# Patient Record
Sex: Male | Born: 1960
Health system: Southern US, Community
[De-identification: ages and names within clinical notes are randomized; demographics above are authoritative.]

## PROBLEM LIST (undated history)

## (undated) DIAGNOSIS — T7840XA Allergy, unspecified, initial encounter: Secondary | ICD-10-CM

## (undated) DIAGNOSIS — K559 Vascular disorder of intestine, unspecified: Secondary | ICD-10-CM

## (undated) HISTORY — DX: Allergy, unspecified, initial encounter: T78.40XA

## (undated) HISTORY — DX: Vascular disorder of intestine, unspecified: K55.9

---

## 2014-03-01 ENCOUNTER — Ambulatory Visit (INDEPENDENT_AMBULATORY_CARE_PROVIDER_SITE_OTHER): Payer: BC Managed Care – PPO | Admitting: Family Medicine

## 2014-03-01 VITALS — BP 138/90 | HR 72 | Temp 97.8°F | Resp 18 | Ht 71.0 in | Wt 227.2 lb

## 2014-03-01 DIAGNOSIS — R220 Localized swelling, mass and lump, head: Secondary | ICD-10-CM

## 2014-03-01 DIAGNOSIS — R05 Cough: Secondary | ICD-10-CM

## 2014-03-01 DIAGNOSIS — R059 Cough, unspecified: Secondary | ICD-10-CM

## 2014-03-01 DIAGNOSIS — R221 Localized swelling, mass and lump, neck: Secondary | ICD-10-CM

## 2014-03-01 DIAGNOSIS — J329 Chronic sinusitis, unspecified: Secondary | ICD-10-CM

## 2014-03-01 MED ORDER — AMOXICILLIN-POT CLAVULANATE 875-125 MG PO TABS: 1.0000 | ORAL_TABLET | Freq: Two times a day (BID) | ORAL | Status: DC

## 2014-03-01 MED ORDER — HYDROCOD POLST-CHLORPHEN POLST 10-8 MG/5ML PO LQCR: 5.0000 mL | Freq: Every evening | ORAL | Status: DC | PRN

## 2014-03-01 MED ORDER — PREDNISONE 20 MG PO TABS: ORAL_TABLET | ORAL | Status: DC

## 2014-03-01 NOTE — Progress Notes (Signed)
° °  Subjective:    Patient ID: Billy Keller., male    DOB: 01/04/1961, 53 y.o.   MRN: [id number]  URI  Associated symptoms include congestion and coughing.   Chief Complaint  Patient presents with   URI    Nasal/cough-yellow green; chest tightness;chest congestion x 2 wks   This chart was scribed for [name], MD by Thea Alken, ED Scribe. This patient was seen in room 13 and the patient's care was started at 10:39 AM.  HPI Comments: Selso Mannor. is a 53 y.o. male who presents to the [facility] complaining of a productive cough consisting of yellow-green sputum and nasal congestion. Pt reports he has been coughing during the night. Pt was seen 2 weeks ago by PCP and was prescribed with cough syrup relief to symptoms. Pt report he experiences these symptoms annually due to tree pollen. Pt states he has to wear a mask when working outside. Pt denies allergies to medication.  Pt works in [employer]   History reviewed. No pertinent past medical history. No Known Allergies Prior to Admission medications   Not on File    Review of Systems  HENT: Positive for congestion.   Respiratory: Positive for cough.        Objective:   Physical Exam  Nursing note and vitals reviewed. Constitutional: He is oriented to person, place, and time. He appears well-developed and well-nourished. No distress.  HENT:  Head: Normocephalic and atraumatic.  Right Ear: Tympanic membrane normal.  Left Ear: Tympanic membrane normal.  Mouth/Throat: Posterior oropharyngeal erythema present.  Mucous purulent discharge from nasal passageway   Eyes: EOM are normal.  Neck: Neck supple.  Cardiovascular: Normal rate.   Pulmonary/Chest: Effort normal. He has wheezes ( faint).  Musculoskeletal: Normal range of motion.  Neurological: He is alert and oriented to person, place, and time.  Skin: Skin is warm and dry.  Psychiatric: He has a normal mood and affect. His behavior is normal.        Assessment & Plan:   1. Sinusitis   2. Cough    Meds ordered this encounter  Medications   amoxicillin-clavulanate (AUGMENTIN) 875-125 MG per tablet    Sig: Take 1 tablet by mouth 2 (two) times daily.    Dispense:  20 tablet    Refill:  0   chlorpheniramine-HYDROcodone (TUSSIONEX PENNKINETIC ER) 10-8 MG/5ML LQCR    Sig: Take 5 mLs by mouth at bedtime as needed for cough.    Dispense:  115 mL    Refill:  0   predniSONE (DELTASONE) 20 MG tablet    Sig: 2 daily with food    Dispense:  10 tablet    Refill:  1    Sinusitis - Plan: amoxicillin-clavulanate (AUGMENTIN) 875-125 MG per tablet, chlorpheniramine-HYDROcodone (TUSSIONEX PENNKINETIC ER) 10-8 MG/5ML LQCR, predniSONE (DELTASONE) 20 MG tablet  Cough - Plan: chlorpheniramine-HYDROcodone (TUSSIONEX PENNKINETIC ER) 10-8 MG/5ML LQCR  Signed, [name], MD

## 2014-03-01 NOTE — Patient Instructions (Signed)

## 2014-06-27 ENCOUNTER — Other Ambulatory Visit: Payer: Self-pay | Admitting: Family Medicine

## 2014-07-14 ENCOUNTER — Ambulatory Visit (INDEPENDENT_AMBULATORY_CARE_PROVIDER_SITE_OTHER): Payer: BC Managed Care – PPO

## 2014-07-14 ENCOUNTER — Ambulatory Visit (INDEPENDENT_AMBULATORY_CARE_PROVIDER_SITE_OTHER): Payer: BC Managed Care – PPO | Admitting: Emergency Medicine

## 2014-07-14 VITALS — BP 130/86 | HR 89 | Temp 98.0°F | Resp 16 | Ht 72.0 in | Wt 228.4 lb

## 2014-07-14 DIAGNOSIS — M25511 Pain in right shoulder: Secondary | ICD-10-CM

## 2014-07-14 DIAGNOSIS — M25512 Pain in left shoulder: Secondary | ICD-10-CM

## 2014-07-14 MED ORDER — TRIAMCINOLONE ACETONIDE 40 MG/ML IJ SUSP
40.0000 mg | Freq: Once | INTRAMUSCULAR | Status: AC
  Administered 2014-07-14: 40 mg via INTRAMUSCULAR

## 2014-07-14 MED ORDER — HYDROCODONE-ACETAMINOPHEN 5-325 MG PO TABS
ORAL_TABLET | ORAL | Status: DC
Start: 2014-07-14 — End: 2014-10-27

## 2014-07-14 MED ORDER — FEXOFENADINE-PSEUDOEPHED ER 180-240 MG PO TB24: 1.0000 | ORAL_TABLET | Freq: Every day | ORAL | Status: DC

## 2014-07-14 MED ORDER — MELOXICAM 15 MG PO TABS: 15.0000 mg | ORAL_TABLET | Freq: Every day | ORAL | Status: DC

## 2014-07-14 NOTE — Patient Instructions (Signed)
Rotator Cuff Injury Rotator cuff injury is any type of injury to the set of muscles and tendons that make up the stabilizing unit of your shoulder. This unit holds the ball of your upper arm bone (humerus) in the socket of your shoulder blade (scapula).  CAUSES Injuries to your rotator cuff most commonly come from sports or activities that cause your arm to be moved repeatedly over your head. Examples of this include throwing, weight lifting, swimming, or racquet sports. Long lasting (chronic) irritation of your rotator cuff can cause soreness and swelling (inflammation), bursitis, and eventual damage to your tendons, such as a tear (rupture). SIGNS AND SYMPTOMS Acute rotator cuff tear:  Sudden tearing sensation followed by severe pain shooting from your upper shoulder down your arm toward your elbow.  Decreased range of motion of your shoulder because of pain and muscle spasm.  Severe pain.  Inability to raise your arm out to the side because of pain and loss of muscle power (large tears). Chronic rotator cuff tear:  Pain that usually is worse at night and may interfere with sleep.  Gradual weakness and decreased shoulder motion as the pain worsens.  Decreased range of motion. Rotator cuff tendinitis:  Deep ache in your shoulder and the outside upper arm over your shoulder.  Pain that comes on gradually and becomes worse when lifting your arm to the side or turning it inward. DIAGNOSIS Rotator cuff injury is diagnosed through a medical history, physical exam, and imaging exam. The medical history helps determine the type of rotator cuff injury. Your health care provider will look at your injured shoulder, feel the injured area, and ask you to move your shoulder in different positions. X-ray exams typically are done to rule out other causes of shoulder pain, such as fractures. MRI is the exam of choice for the most severe shoulder injuries because the images show muscles and tendons.    TREATMENT  Chronic tear:  Medicine for pain, such as acetaminophen or ibuprofen.  Physical therapy and range-of-motion exercises may be helpful in maintaining shoulder function and strength.  Steroid injections into your shoulder joint.  Surgical repair of the rotator cuff if the injury does not heal with noninvasive treatment. Acute tear:  Anti-inflammatory medicines such as ibuprofen and naproxen to help reduce pain and swelling.  A sling to help support your arm and rest your rotator cuff muscles. Long-term use of a sling is not advised. It may cause significant stiffening of the shoulder joint.  Surgery may be considered within a few weeks, especially in younger, active people, to return the shoulder to full function.  Indications for surgical treatment include the following:  Age younger than 60 years.  Rotator cuff tears that are complete.  Physical therapy, rest, and anti-inflammatory medicines have been used for 6-8 weeks, with no improvement.  Employment or sporting activity that requires constant shoulder use. Tendinitis:  Anti-inflammatory medicines such as ibuprofen and naproxen to help reduce pain and swelling.  A sling to help support your arm and rest your rotator cuff muscles. Long-term use of a sling is not advised. It may cause significant stiffening of the shoulder joint.  Severe tendinitis may require:  Steroid injections into your shoulder joint.  Physical therapy.  Surgery. HOME CARE INSTRUCTIONS   Apply ice to your injury:  Put ice in a plastic bag.  Place a towel between your skin and the bag.  Leave the ice on for 20 minutes, 2-3 times a day.  If you   have a shoulder immobilizer (sling and straps), wear it until told otherwise by your health care provider.  You may want to sleep on several pillows or in a recliner at night to lessen swelling and pain.  Only take over-the-counter or prescription medicines for pain, discomfort, or fever as  directed by your health care provider.  Do simple hand squeezing exercises with a soft rubber ball to decrease hand swelling. SEEK MEDICAL CARE IF:   Your shoulder pain increases, or new pain or numbness develops in your arm, hand, or fingers.  Your hand or fingers are colder than your other hand. SEEK IMMEDIATE MEDICAL CARE IF:   Your arm, hand, or fingers are numb or tingling.  Your arm, hand, or fingers are increasingly swollen and painful, or they turn white or blue. MAKE SURE YOU:  Understand these instructions.  Will watch your condition.  Will get help right away if you are not doing well or get worse. Document Released: 09/21/2000 Document Revised: 09/29/2013 Document Reviewed: 05/06/2013 [employer] Patient Information 2015 [employer]. This information is not intended to replace advice given to you by your health care provider. Make sure you discuss any questions you have with your health care provider.  

## 2014-07-14 NOTE — Progress Notes (Signed)
Subjective:    Patient ID: Billy Keller., male    DOB: 1961-01-16, 53 y.o.   MRN: [id number] This chart was scribed for Billy Queen, MD by Marti Sleigh, Medical Scribe. This patient was seen in Room 1 and the patient's care was started at 11:21 AM.  Shoulder Pain  The pain is present in the right shoulder and left shoulder. This is a chronic problem. The current episode started more than 1 month ago. There has been no history of extremity trauma. The problem occurs constantly. The problem has been waxing and waning. Pertinent negatives include no limited range of motion. The symptoms are aggravated by activity.   HPI Comments: [name]. is a 53 y.o. male who presents to [facility] complaining of constant bilateral shoulder pain that started two months ago, and is worse in right than left. Pt states he takes ibuprofen regularly for pain. 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 Pt states that he does Physical Therapy exercises that he got from a PT when he was younger for a similar issue. Pt has not seen anyone for his pain in many years.    Review of Systems  Constitutional: Negative for appetite change and fatigue.  HENT: Negative for congestion, ear discharge and sinus pressure.   Eyes: Negative for discharge.  Respiratory: Negative for cough.   Cardiovascular: Negative for chest pain.  Gastrointestinal: Negative for abdominal pain and diarrhea.  Genitourinary: Negative for frequency and hematuria.  Musculoskeletal:       Bilateral shoulder pain, worse on right.  Skin: Negative for rash.  Neurological: Negative for seizures and headaches.  Psychiatric/Behavioral: Negative for hallucinations.       Objective:   Physical Exam  Constitutional: He is oriented to person, place, and time. He appears well-developed.  HENT:  Head: Normocephalic.  Eyes: Conjunctivae and EOM are normal. No scleral icterus.  Neck: Neck supple. No  thyromegaly present.  Cardiovascular: Normal rate and regular rhythm.  Exam reveals no gallop and no friction rub.   No murmur heard. Pulmonary/Chest: No stridor. He has no wheezes. He has no rales. He exhibits no tenderness.  Abdominal: He exhibits no distension. There is no tenderness. There is no rebound.  Musculoskeletal: Normal range of motion. He exhibits no tenderness.  Full ROM, bilaterally. Motor strength and reflex normal. Weakness on external rotation with resistance. No internal rotation weakness.  Lymphadenopathy:    He has no cervical adenopathy.  Neurological: He is oriented to person, place, and time. He exhibits normal muscle tone. Coordination normal.  Skin: No rash noted. No erythema.  Psychiatric: He has a normal mood and affect. His behavior is normal.   [facility] reading (PRIMARY) by  Dr.Zyler Hyson there are mild arthritic changes of both shoulders worse on the right  PROCEDURE NOTE: The subdeltoid area was prepped with Betadine x2 swab with an alcohol swab and while wearing gloves the area was numbed with ethyl chloride followed by injection with 40 of Kenalog and 2 cc of 2% plain. The patient tolerated the procedure well      Assessment & Plan:  Patient with a tendinitis bursitis of the right shoulder. He is having chronic shoulder problems. He was given Thera-Band for his shoulder exercises. I told him I would be happy to refer him to physical therapy. I did refill his Allegra D. he takes for chronic sinus problems. I did not feel his sleep medicine. I did place him  on Mobic one every morning along with hydrocodone at night for pain.

## 2014-10-19 ENCOUNTER — Telehealth: Payer: Self-pay | Admitting: Internal Medicine

## 2014-10-19 ENCOUNTER — Ambulatory Visit (INDEPENDENT_AMBULATORY_CARE_PROVIDER_SITE_OTHER): Payer: BLUE CROSS/BLUE SHIELD | Admitting: Internal Medicine

## 2014-10-19 ENCOUNTER — Ambulatory Visit: Payer: Self-pay | Admitting: Physician Assistant

## 2014-10-19 VITALS — BP 180/96 | HR 52 | Temp 98.0°F | Resp 18 | Ht 72.5 in | Wt 224.0 lb

## 2014-10-19 DIAGNOSIS — R112 Nausea with vomiting, unspecified: Secondary | ICD-10-CM

## 2014-10-19 DIAGNOSIS — R197 Diarrhea, unspecified: Secondary | ICD-10-CM

## 2014-10-19 DIAGNOSIS — K529 Noninfective gastroenteritis and colitis, unspecified: Secondary | ICD-10-CM

## 2014-10-19 DIAGNOSIS — K5289 Other specified noninfective gastroenteritis and colitis: Secondary | ICD-10-CM

## 2014-10-19 DIAGNOSIS — K921 Melena: Secondary | ICD-10-CM

## 2014-10-19 DIAGNOSIS — A09 Infectious gastroenteritis and colitis, unspecified: Secondary | ICD-10-CM

## 2014-10-19 LAB — POCT UA - MICROSCOPIC ONLY
CRYSTALS, UR, HPF, POC: NEGATIVE
Casts, Ur, LPF, POC: NEGATIVE
Mucus, UA: POSITIVE
Yeast, UA: NEGATIVE

## 2014-10-19 LAB — POCT CBC
Granulocyte percent: 86.1 %G — AB (ref 37–80)
HEMATOCRIT: 45.9 % (ref 43.5–53.7)
HEMOGLOBIN: 15.4 g/dL (ref 14.1–18.1)
LYMPH, POC: 0.9 (ref 0.6–3.4)
MCH: 31.7 pg — AB (ref 27–31.2)
MCHC: 33.5 g/dL (ref 31.8–35.4)
MCV: 94.5 fL (ref 80–97)
MID (cbc): 0.5 (ref 0–0.9)
MPV: 6.9 fL (ref 0–99.8)
POC Granulocyte: 8.5 — AB (ref 2–6.9)
POC LYMPH %: 9.1 % — AB (ref 10–50)
POC MID %: 4.8 %M (ref 0–12)
Platelet Count, POC: 266 10*3/uL (ref 142–424)
RBC: 4.86 M/uL (ref 4.69–6.13)
RDW, POC: 12.6 %
WBC: 9.9 10*3/uL (ref 4.6–10.2)

## 2014-10-19 LAB — POCT URINALYSIS DIPSTICK
Bilirubin, UA: NEGATIVE
Glucose, UA: NEGATIVE
Leukocytes, UA: NEGATIVE
Nitrite, UA: NEGATIVE
UROBILINOGEN UA: 0.2
pH, UA: 5.5

## 2014-10-19 LAB — COMPREHENSIVE METABOLIC PANEL
ALT: 39 U/L (ref 0–53)
AST: 21 U/L (ref 0–37)
Albumin: 4.1 g/dL (ref 3.5–5.2)
Alkaline Phosphatase: 65 U/L (ref 39–117)
BUN: 17 mg/dL (ref 6–23)
CO2: 25 meq/L (ref 19–32)
Calcium: 9.7 mg/dL (ref 8.4–10.5)
Chloride: 102 mEq/L (ref 96–112)
Creat: 0.92 mg/dL (ref 0.50–1.35)
Glucose, Bld: 122 mg/dL — ABNORMAL HIGH (ref 70–99)
Potassium: 4.5 mEq/L (ref 3.5–5.3)
SODIUM: 135 meq/L (ref 135–145)
TOTAL PROTEIN: 6.9 g/dL (ref 6.0–8.3)
Total Bilirubin: 0.8 mg/dL (ref 0.2–1.2)

## 2014-10-19 LAB — IFOBT (OCCULT BLOOD): IMMUNOLOGICAL FECAL OCCULT BLOOD TEST: POSITIVE

## 2014-10-19 LAB — LIPASE: Lipase: 11 U/L (ref 0–75)

## 2014-10-19 MED ORDER — ONDANSETRON 4 MG PO TBDP: 4.0000 mg | ORAL_TABLET | Freq: Once | ORAL | Status: DC

## 2014-10-19 MED ORDER — HYDROCODONE-ACETAMINOPHEN 5-325 MG PO TABS
1.0000 | ORAL_TABLET | Freq: Four times a day (QID) | ORAL | Status: DC | PRN
Start: 2014-10-19 — End: 2015-04-04

## 2014-10-19 MED ORDER — CIPROFLOXACIN HCL 500 MG PO TABS: 500.0000 mg | ORAL_TABLET | Freq: Two times a day (BID) | ORAL | Status: DC

## 2014-10-19 MED ORDER — ONDANSETRON HCL 8 MG PO TABS: 8.0000 mg | ORAL_TABLET | Freq: Three times a day (TID) | ORAL | Status: DC | PRN

## 2014-10-19 MED ORDER — ONDANSETRON 4 MG PO TBDP
8.0000 mg | ORAL_TABLET | Freq: Once | ORAL | Status: AC
  Administered 2014-10-19: 8 mg via ORAL

## 2014-10-19 NOTE — Progress Notes (Signed)
Subjective:    Patient ID: Billy Belts., male    DOB: 11-Mar-1961, 54 y.o.   MRN: [id number]  HPI CO 10hrs of cramps and diarrhea and vomiting for 4 hrs 3-4 times. Stool is brown and vomitus is bilious. Crampy pain, no fever but sweaty. No other family sick. Could have been chx salad from yesterday. He is alert and jokes while giving hx.NO chronic diseases.   Review of Systems neg    Objective:   Physical Exam  Constitutional: He is oriented to person, place, and time. He appears well-developed and well-nourished. No distress.  HENT:  Head: Normocephalic.  Right Ear: External ear normal.  Left Ear: External ear normal.  Mouth/Throat: Oropharynx is clear and moist.  Eyes: EOM are normal. Pupils are equal, round, and reactive to light. No scleral icterus.  Neck: Normal range of motion.  Cardiovascular: Normal rate, regular rhythm and normal heart sounds.   Pulmonary/Chest: Effort normal and breath sounds normal.  Abdominal: He exhibits no distension and no mass. There is no tenderness. There is no guarding.  Neurological: He is alert and oriented to person, place, and time. He exhibits normal muscle tone. Coordination normal.  Psychiatric: He has a normal mood and affect. His behavior is normal. Thought content normal.  Vitals reviewed.  BP lying 170/92 pulse 49  Standing 180/52 pulse 52  No postural signs  Zofran ODT 8mg  now Trial oral hydration Results for orders placed or performed in visit on 10/19/14  POCT UA - Microscopic Only  Result Value Ref Range   WBC, Ur, HPF, POC 4-12    RBC, urine, microscopic 1-6    Bacteria, U Microscopic trace    Mucus, UA positive    Epithelial cells, urine per micros 1-2    Crystals, Ur, HPF, POC negative    Casts, Ur, LPF, POC negative    Yeast, UA negative   POCT urinalysis dipstick  Result Value Ref Range   Color, UA Yellow    Clarity, UA clear    Glucose, UA Neg    Bilirubin, UA Neg    Ketones, UA Trace    Spec Grav, UA  >=1.030    Blood, UA Trace    pH, UA 5.5    Protein, UA Trace    Urobilinogen, UA 0.2    Nitrite, UA Neg    Leukocytes, UA Negative   POCT CBC  Result Value Ref Range   WBC 9.9 4.6 - 10.2 K/uL   Lymph, poc 0.9 0.6 - 3.4   POC LYMPH PERCENT 9.1 (A) 10 - 50 %L   MID (cbc) 0.5 0 - 0.9   POC MID % 4.8 0 - 12 %M   POC Granulocyte 8.5 (A) 2 - 6.9   Granulocyte percent 86.1 (A) 37 - 80 %G   RBC 4.86 4.69 - 6.13 M/uL   Hemoglobin 15.4 14.1 - 18.1 g/dL   HCT, POC 45.9 43.5 - 53.7 %   MCV 94.5 80 - 97 fL   MCH, POC 31.7 (A) 27 - 31.2 pg   MCHC 33.5 31.8 - 35.4 g/dL   RDW, POC 12.6 %   Platelet Count, POC 266 142 - 424 K/uL   MPV 6.9 0 - 99.8 fL   Has kept 2 bottles gatorade down, no vomiting.Diarrhea persists On chart review bradycardia is new and could indicate salmonella infection      Assessment & Plan:  Possible salmonella gastroenteritis/Culture obtained/Frank bloody colitis Bradycardia suggestive of salmonella bradycardia syndrome Cipro 500mg   bid 20 Zofran 8mg  tid prn/Immodium prn Diet hydration See GI today or tomorrow appt made

## 2014-10-19 NOTE — Patient Instructions (Addendum)
Viral Gastroenteritis Viral gastroenteritis is also known as stomach flu. This condition affects the stomach and intestinal tract. It can cause sudden diarrhea and vomiting. The illness typically lasts 3 to 8 days. Most people develop an immune response that eventually gets rid of the virus. While this natural response develops, the virus can make you quite ill. CAUSES  Many different viruses can cause gastroenteritis, such as rotavirus or noroviruses. You can catch one of these viruses by consuming contaminated food or water. You may also catch a virus by sharing utensils or other personal items with an infected person or by touching a contaminated surface. SYMPTOMS  The most common symptoms are diarrhea and vomiting. These problems can cause a severe loss of body fluids (dehydration) and a body salt (electrolyte) imbalance. Other symptoms may include:  Fever.  Headache.  Fatigue.  Abdominal pain. DIAGNOSIS  Your caregiver can usually diagnose viral gastroenteritis based on your symptoms and a physical exam. A stool sample may also be taken to test for the presence of viruses or other infections. TREATMENT  This illness typically goes away on its own. Treatments are aimed at rehydration. The most serious cases of viral gastroenteritis involve vomiting so severely that you are not able to keep fluids down. In these cases, fluids must be given through an intravenous line (IV). HOME CARE INSTRUCTIONS   Drink enough fluids to keep your urine clear or pale yellow. Drink small amounts of fluids frequently and increase the amounts as tolerated.  Ask your caregiver for specific rehydration instructions.  Avoid:  Foods high in sugar.  Alcohol.  Carbonated drinks.  Tobacco.  Juice.  Caffeine drinks.  Extremely hot or cold fluids.  Fatty, greasy foods.  Too much intake of anything at one time.  Dairy products until 24 to 48 hours after diarrhea stops.  You may consume probiotics.  Probiotics are active cultures of beneficial bacteria. They may lessen the amount and number of diarrheal stools in adults. Probiotics can be found in yogurt with active cultures and in supplements.  Wash your hands well to avoid spreading the virus.  Only take over-the-counter or prescription medicines for pain, discomfort, or fever as directed by your caregiver. Do not give aspirin to children. Antidiarrheal medicines are not recommended.  Ask your caregiver if you should continue to take your regular prescribed and over-the-counter medicines.  Keep all follow-up appointments as directed by your caregiver. SEEK IMMEDIATE MEDICAL CARE IF:   You are unable to keep fluids down.  You do not urinate at least once every 6 to 8 hours.  You develop shortness of breath.  You notice blood in your stool or vomit. This may look like coffee grounds.  You have abdominal pain that increases or is concentrated in one small area (localized).  You have persistent vomiting or diarrhea.  You have a fever.  The patient is a child younger than 3 months, and he or she has a fever.  The patient is a child older than 3 months, and he or she has a fever and persistent symptoms.  The patient is a child older than 3 months, and he or she has a fever and symptoms suddenly get worse.  The patient is a baby, and he or she has no tears when crying. MAKE SURE YOU:   Understand these instructions.  Will watch your condition.  Will get help right away if you are not doing well or get worse. Document Released: 09/24/2005 Document Revised: 12/17/2011 Document Reviewed: 07/11/2011   Exit[employer] Patient Information 2015 [place]. This information is not intended to replace advice given to you by your health care provider. Make sure you discuss any questions you have with your health care provider. Food Choices to Help Relieve Diarrhea When you have diarrhea, the foods you eat and your eating habits are very  important. Choosing the right foods and drinks can help relieve diarrhea. Also, because diarrhea can last up to 7 days, you need to replace lost fluids and electrolytes (such as sodium, potassium, and chloride) in order to help prevent dehydration.  WHAT GENERAL GUIDELINES DO I NEED TO FOLLOW?  Slowly drink 1 cup (8 oz) of fluid for each episode of diarrhea. If you are getting enough fluid, your urine will be clear or pale yellow.  Eat starchy foods. Some good choices include white rice, white toast, pasta, low-fiber cereal, baked potatoes (without the skin), saltine crackers, and bagels.  Avoid large servings of any cooked vegetables.  Limit fruit to two servings per day. A serving is  cup or 1 small piece.  Choose foods with less than 2 g of fiber per serving.  Limit fats to less than 8 tsp (38 g) per day.  Avoid fried foods.  Eat foods that have probiotics in them. Probiotics can be found in certain dairy products.  Avoid foods and beverages that may increase the speed at which food moves through the stomach and intestines (gastrointestinal tract). Things to avoid include:  High-fiber foods, such as dried fruit, raw fruits and vegetables, nuts, seeds, and whole grain foods.  Spicy foods and high-fat foods.  Foods and beverages sweetened with high-fructose corn syrup, honey, or sugar alcohols such as xylitol, sorbitol, and mannitol. WHAT FOODS ARE RECOMMENDED? Grains White rice. White, [place], or pita breads (fresh or toasted), including plain rolls, buns, or bagels. White pasta. Saltine, soda, or graham crackers. Pretzels. Low-fiber cereal. Cooked cereals made with water (such as cornmeal, farina, or cream cereals). Plain muffins. Matzo. Melba toast. Zwieback.  Vegetables Potatoes (without the skin). Strained tomato and vegetable juices. Most well-cooked and canned vegetables without seeds. Tender lettuce. Fruits Cooked or canned applesauce, apricots, cherries, fruit cocktail,  grapefruit, peaches, pears, or plums. Fresh bananas, apples without skin, cherries, grapes, cantaloupe, grapefruit, peaches, oranges, or plums.  Meat and Other Protein Products Baked or boiled chicken. Eggs. Tofu. Fish. Seafood. Smooth peanut butter. Ground or well-cooked tender beef, ham, veal, lamb, pork, or poultry.  Dairy Plain yogurt, kefir, and unsweetened liquid yogurt. Lactose-free milk, buttermilk, or soy milk. Plain hard cheese. Beverages Sport drinks. Clear broths. Diluted fruit juices (except prune). Regular, caffeine-free sodas such as ginger ale. Water. Decaffeinated teas. Oral rehydration solutions. Sugar-free beverages not sweetened with sugar alcohols. Other Bouillon, broth, or soups made from recommended foods.  The items listed above may not be a complete list of recommended foods or beverages. Contact your dietitian for more options. WHAT FOODS ARE NOT RECOMMENDED? Grains Whole grain, whole wheat, bran, or rye breads, rolls, pastas, crackers, and cereals. Wild or brown rice. Cereals that contain more than 2 g of fiber per serving. Corn tortillas or taco shells. Cooked or dry oatmeal. Granola. Popcorn. Vegetables Raw vegetables. Cabbage, broccoli, Brussels sprouts, artichokes, baked beans, beet greens, corn, kale, legumes, peas, sweet potatoes, and yams. Potato skins. Cooked spinach and cabbage. Fruits Dried fruit, including raisins and dates. Raw fruits. Stewed or dried prunes. Fresh apples with skin, apricots, mangoes, pears, raspberries, and strawberries.  Meat and Other Protein Products Chunky peanut butter.  Nuts and seeds. Beans and lentils. Berniece Salines.  Dairy High-fat cheeses. Milk, chocolate milk, and beverages made with milk, such as milk shakes. Cream. Ice cream. Sweets and Desserts Sweet rolls, doughnuts, and sweet breads. Pancakes and waffles. Fats and Oils Butter. Cream sauces. Margarine. Salad oils. Plain salad dressings. Olives. Avocados.  Beverages Caffeinated  beverages (such as coffee, tea, soda, or energy drinks). Alcoholic beverages. Fruit juices with pulp. Prune juice. Soft drinks sweetened with high-fructose corn syrup or sugar alcohols. Other Coconut. Hot sauce. Chili powder. Mayonnaise. Gravy. Cream-based or milk-based soups.  The items listed above may not be a complete list of foods and beverages to avoid. Contact your dietitian for more information. WHAT SHOULD I DO IF I BECOME DEHYDRATED? Diarrhea can sometimes lead to dehydration. Signs of dehydration include dark urine and dry mouth and skin. If you think you are dehydrated, you should rehydrate with an oral rehydration solution. These solutions can be purchased at pharmacies, retail stores, or online.  Drink -1 cup (120-240 mL) of oral rehydration solution each time you have an episode of diarrhea. If drinking this amount makes your diarrhea worse, try drinking smaller amounts more often. For example, drink 1-3 tsp (5-15 mL) every 5-10 minutes.  A general rule for staying hydrated is to drink 1-2 L of fluid per day. Talk to your health care provider about the specific amount you should be drinking each day. Drink enough fluids to keep your urine clear or pale yellow. Document Released: 12/15/2003 Document Revised: 09/29/2013 Document Reviewed: 08/17/2013 [facility] Patient Information 2015 [place], [place]. This information is not intended to replace advice given to you by your health care provider. Make sure you discuss any questions you have with your health care provider. Colitis Colitis is inflammation of the colon. Colitis can be a short-term or long-standing (chronic) illness. Crohn's disease and ulcerative colitis are 2 types of colitis which are chronic. They usually require lifelong treatment. CAUSES  There are many different causes of colitis, including:  Viruses.  Germs (bacteria).  Medicine reactions. SYMPTOMS   Diarrhea.  Intestinal bleeding.  Pain.  Fever.  Throwing  up (vomiting).  Tiredness (fatigue).  Weight loss.  Bowel blockage. DIAGNOSIS  The diagnosis of colitis is based on examination and stool or blood tests. X-rays, CT scan, and colonoscopy may also be needed. TREATMENT  Treatment may include:  Fluids given through the vein (intravenously).  Bowel rest (nothing to eat or drink for a period of time).  Medicine for pain and diarrhea.  Medicines (antibiotics) that kill germs.  Cortisone medicines.  Surgery. HOME CARE INSTRUCTIONS   Get plenty of rest.  Drink enough water and fluids to keep your urine clear or pale yellow.  Eat a well-balanced diet.  Call your caregiver for follow-up as recommended. SEEK IMMEDIATE MEDICAL CARE IF:   You develop chills.  You have an oral temperature above 102 F (38.9 C), not controlled by medicine.  You have extreme weakness, fainting, or dehydration.  You have repeated vomiting.  You develop severe belly (abdominal) pain or are passing bloody or tarry stools. MAKE SURE YOU:   Understand these instructions.  Will watch your condition.  Will get help right away if you are not doing well or get worse. Document Released: 11/01/2004 Document Revised: 12/17/2011 Document Reviewed: 01/27/2010 [facility] Patient Information 2015 [place], [place]. This information is not intended to replace advice given to you by your health care provider. Make sure you discuss any questions you have with your health care provider.

## 2014-10-19 NOTE — Telephone Encounter (Signed)
Spoke with Judson Roch and scheduled patient today at 2:15 PM with [employer], PA-C.

## 2014-10-20 ENCOUNTER — Telehealth: Payer: Self-pay | Admitting: Internal Medicine

## 2014-10-20 ENCOUNTER — Encounter: Payer: Self-pay | Admitting: Physician Assistant

## 2014-10-20 ENCOUNTER — Ambulatory Visit (INDEPENDENT_AMBULATORY_CARE_PROVIDER_SITE_OTHER): Payer: BLUE CROSS/BLUE SHIELD | Admitting: Physician Assistant

## 2014-10-20 VITALS — BP 146/86 | HR 77 | Ht 72.0 in | Wt 228.8 lb

## 2014-10-20 DIAGNOSIS — K921 Melena: Secondary | ICD-10-CM

## 2014-10-20 DIAGNOSIS — R197 Diarrhea, unspecified: Secondary | ICD-10-CM

## 2014-10-20 MED ORDER — PEG-KCL-NACL-NASULF-NA ASC-C 100 G PO SOLR: 1.0000 | Freq: Once | ORAL | Status: DC

## 2014-10-20 MED ORDER — MESALAMINE 1000 MG RE SUPP: 1000.0000 mg | Freq: Every day | RECTAL | Status: DC

## 2014-10-20 MED ORDER — METRONIDAZOLE 500 MG PO TABS: 500.0000 mg | ORAL_TABLET | Freq: Three times a day (TID) | ORAL | Status: DC

## 2014-10-20 NOTE — Telephone Encounter (Signed)
Patient seen today for acute GI illness with N,V,D, and minor bleeding (note reviewed). Set up for colonoscopy tomorrow. Completed first part of prep and is having cramping and diarrhea. No vomiting.Discussed condition and options. He will complete second prep session at 3 am, if possible, and keep colonoscopy appointment with Dr. Fuller Plan

## 2014-10-20 NOTE — Patient Instructions (Signed)
Your prescriptions have been sent to your pharmacy Continue Cipro  You have been scheduled for a colonoscopy. Please follow written instructions given to you at your visit today.  Please pick up your prep kit at the pharmacy within the next 1-3 days. If you use inhalers (even only as needed), please bring them with you on the day of your procedure. Your physician has requested that you go to www.startemmi.com and enter the access code given to you at your visit today. This web site gives a general overview about your procedure. However, you should still follow specific instructions given to you by our office regarding your preparation for the procedure.

## 2014-10-20 NOTE — Progress Notes (Signed)
Reviewed and agree with management plan.  Maylea Soria T. Andrik Sandt, MD FACG 

## 2014-10-20 NOTE — Progress Notes (Signed)
Patient ID: Billy Keller., male   DOB: 1961-09-09, 54 y.o.   MRN: [id number]    HPI:    Billy Keller is a 54 year old male referred for evaluation by Dr. Harle Battiest due to recent onset of bloody diarrhea.  Billy Keller states that in the very early morning hours of January 12 he developed severe abdominal cramps, diarrhea, and vomiting and he vomited multiple times over 4 hour period. His stool was brown and watery but had blood mixed in with it. He was evaluated by Dr. Harle Battiest in urgent care and started on Cipro. He states he feels about 50% better. He has not had any diarrhea today but this morning felt like he was going to have gas, sat on the toilet, and passed bright red blood and some small clots. He has mucus with his stools and has the sensation that he constantly has to move his bowels but nothing will come out. He reports that Monday he had lasagna for supper but it lunch had a now and had chicken salad. 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 He has had no recent antibiotics nor has he traveled outside of the country. For the past week he has been using a lot of ibuprofen for low back pain   Past Medical History  Diagnosis Date  . Allergy     No past surgical history on file. Family History  Problem Relation Age of Onset  . Congestive Heart Failure Father    History  Substance Use Topics  . Smoking status: Never Smoker   . Smokeless tobacco: Not on file  . Alcohol Use: Yes     Comment: socially   Current Outpatient Prescriptions  Medication Sig Dispense Refill  . ciprofloxacin (CIPRO) 500 MG tablet Take 1 tablet (500 mg total) by mouth 2 (two) times daily. 20 tablet 0  . fexofenadine-pseudoephedrine (ALLEGRA-D 24) 180-240 MG per 24 hr tablet Take 1 tablet by mouth daily. 30 tablet 2  . HYDROcodone-acetaminophen (NORCO) 5-325 MG per tablet Take one tablet primarily at night for joint pain 20 tablet 0  . HYDROcodone-acetaminophen  (NORCO/VICODIN) 5-325 MG per tablet Take 1 tablet by mouth every 6 (six) hours as needed. 30 tablet 0  . meloxicam (MOBIC) 15 MG tablet Take 1 tablet (15 mg total) by mouth daily. 14 tablet 1  . ondansetron (ZOFRAN) 8 MG tablet Take 1 tablet (8 mg total) by mouth every 8 (eight) hours as needed for nausea or vomiting. 20 tablet 0  . mesalamine (CANASA) 1000 MG suppository Place 1 suppository (1,000 mg total) rectally at bedtime. 7 suppository 1  . metroNIDAZOLE (FLAGYL) 500 MG tablet Take 1 tablet (500 mg total) by mouth 3 (three) times daily. 21 tablet 0  . peg 3350 powder (MOVIPREP) 100 G SOLR Take 1 kit (200 g total) by mouth once. 1 kit 0   No current facility-administered medications for this visit.   No Known Allergies   Review of Systems: Gen: Denies any fever, chills, sweats, anorexia, fatigue, weakness, malaise, weight loss, and sleep disorder CV: Denies chest pain, angina, palpitations, syncope, orthopnea, PND, peripheral edema, and claudication. Resp: Denies dyspnea at rest, dyspnea with exercise, cough, sputum, wheezing, coughing up blood, and pleurisy. GI: Denies vomiting blood, jaundice, and fecal incontinence.   Denies dysphagia or odynophagia. GU : Denies urinary burning, blood in urine, urinary frequency, urinary hesitancy, nocturnal urination, and urinary incontinence. MS: Denies joint pain, limitation  of movement, and swelling, stiffness, low back pain, extremity pain. Denies muscle weakness, cramps, atrophy.  Derm: Denies rash, itching, dry skin, hives, moles, warts, or unhealing ulcers.  Psych: Denies depression, anxiety, memory loss, suicidal ideation, hallucinations, paranoia, and confusion. Heme: Denies bruising, bleeding, and enlarged lymph nodes. Neuro:  Denies any headaches, dizziness, paresthesias. Endo:  Denies any problems with DM, thyroid, adrenal function  Studies: No results found.  LAB RESULTS:  Recent Labs  10/19/14 1049  WBC 9.9  HGB 15.4  HCT  45.9   BMET  Recent Labs  10/19/14 1027  NA 135  K 4.5  CL 102  CO2 25  GLUCOSE 122*  BUN 17  CREATININE 0.92  CALCIUM 9.7   LFT  Recent Labs  10/19/14 1027  PROT 6.9  ALBUMIN 4.1  AST 21  ALT 39  ALKPHOS 65  BILITOT 0.8     Physical Exam: BP 146/86 mmHg  Pulse 77  Ht 6' (1.829 m)  Wt 228 lb 12.8 oz (103.783 kg)  BMI 31.02 kg/m2  SpO2 98% Constitutional: Pleasant,well-developed male in no acute distress. HEENT: Normocephalic and atraumatic. Conjunctivae are normal. No scleral icterus. Neck supple. No thyromegaly Cardiovascular: Normal rate, regular rhythm.  Pulmonary/chest: Effort normal and breath sounds normal. No wheezing, rales or rhonchi. Abdominal: Soft, nondistended, nontender. Bowel sounds active throughout. There are no masses palpable. No hepatomegaly. Rectal: brown watery stool, heme positive Extremities: no edema Lymphadenopathy: No cervical adenopathy noted. Neurological: Alert and oriented to person place and time. Skin: Skin is warm and dry. No rashes noted. Psychiatric: Normal mood and affect. Behavior is normal.  ASSESSMENT AND PLAN: 54 year old male with a 2 day history of hematochezia and diarrhea referred for evaluation. Stool cultures were ordered by urgent care and are pending. He will be scheduled for a colonoscopy as he is over 35, to evaluate for an inflammatory process, polyps, or neoplasia.The risks, benefits, and alternatives to colonoscopy with possible biopsy and possible polypectomy were discussed with the patient and they consent to proceed. In the meantime we will add Cipro 500 mg by mouth 3 times a day for 7 days to use along with the Cipro that he has. He will also be given a trial of Canasa suppositories 1 per rectum for 7 days. Further recommendations will be made pending the findings of his colonoscopy which will be scheduled with Dr. Fuller Plan.    Billy Keller, Vita Barley PA-C 10/20/2014, 4:28 PM

## 2014-10-21 ENCOUNTER — Ambulatory Visit (AMBULATORY_SURGERY_CENTER): Payer: BLUE CROSS/BLUE SHIELD | Admitting: Gastroenterology

## 2014-10-21 ENCOUNTER — Encounter: Payer: Self-pay | Admitting: Gastroenterology

## 2014-10-21 VITALS — BP 135/86 | HR 56 | Temp 97.3°F | Resp 13 | Ht 72.0 in | Wt 228.0 lb

## 2014-10-21 DIAGNOSIS — K921 Melena: Secondary | ICD-10-CM

## 2014-10-21 DIAGNOSIS — K639 Disease of intestine, unspecified: Secondary | ICD-10-CM

## 2014-10-21 DIAGNOSIS — R197 Diarrhea, unspecified: Secondary | ICD-10-CM

## 2014-10-21 MED ORDER — SODIUM CHLORIDE 0.9 % IV SOLN: 500.0000 mL | INTRAVENOUS | Status: DC

## 2014-10-21 NOTE — Progress Notes (Signed)
Called to room to assist during endoscopic procedure.  Patient ID and intended procedure confirmed with present staff. Received instructions for my participation in the procedure from the performing physician.  

## 2014-10-21 NOTE — Patient Instructions (Signed)
Discharge instructions given. Biopsies taken. Resume previous medications. Call office for follow up appointment in 1 week. YOU HAD AN ENDOSCOPIC PROCEDURE TODAY AT [facility]: Refer to the procedure report that was given to you for any specific questions about what was found during the examination.  If the procedure report does not answer your questions, please call your gastroenterologist to clarify.  If you requested that your care partner not be given the details of your procedure findings, then the procedure report has been included in a sealed envelope for you to review at your convenience later.  YOU SHOULD EXPECT: Some feelings of bloating in the abdomen. Passage of more gas than usual.  Walking can help get rid of the air that was put into your GI tract during the procedure and reduce the bloating. If you had a lower endoscopy (such as a colonoscopy or flexible sigmoidoscopy) you may notice spotting of blood in your stool or on the toilet paper. If you underwent a bowel prep for your procedure, then you may not have a normal bowel movement for a few days.  DIET: Your first meal following the procedure should be a light meal and then it is ok to progress to your normal diet.  A half-sandwich or bowl of soup is an example of a good first meal.  Heavy or fried foods are harder to digest and may make you feel nauseous or bloated.  Likewise meals heavy in dairy and vegetables can cause extra gas to form and this can also increase the bloating.  Drink plenty of fluids but you should avoid alcoholic beverages for 24 hours.  ACTIVITY: Your care partner should take you home directly after the procedure.  You should plan to take it easy, moving slowly for the rest of the day.  You can resume normal activity the day after the procedure however you should NOT DRIVE or use heavy machinery for 24 hours (because of the sedation medicines used during the test).    SYMPTOMS TO REPORT  IMMEDIATELY: A gastroenterologist can be reached at any hour.  During normal business hours, 8:30 AM to 5:00 PM Monday through Friday, call [phone].  After hours and on weekends, please call the GI answering service at [phone] who will take a message and have the physician on call contact you.   Following lower endoscopy (colonoscopy or flexible sigmoidoscopy):  Excessive amounts of blood in the stool  Significant tenderness or worsening of abdominal pains  Swelling of the abdomen that is new, acute  Fever of 100F or higher FOLLOW UP: If any biopsies were taken you will be contacted by phone or by letter within the next 1-3 weeks.  Call your gastroenterologist if you have not heard about the biopsies in 3 weeks.  Our staff will call the home number listed on your records the next business day following your procedure to check on you and address any questions or concerns that you may have at that time regarding the information given to you following your procedure. This is a courtesy call and so if there is no answer at the home number and we have not heard from you through the emergency physician on call, we will assume that you have returned to your regular daily activities without incident.  SIGNATURES/CONFIDENTIALITY: You and/or your care partner have signed paperwork which will be entered into your electronic medical record.  These signatures attest to the fact that that the information above on your After  Visit Summary has been reviewed and is understood.  Full responsibility of the confidentiality of this discharge information lies with you and/or your care-partner. 

## 2014-10-21 NOTE — Progress Notes (Signed)
Report to PACU, RN, vss, BBS= Clear.  

## 2014-10-21 NOTE — Op Note (Signed)
[place]  [employer] [place], [id number]   COLONOSCOPY PROCEDURE REPORT  PATIENT: Billy Keller, Billy Keller  MR#: [id number] BIRTHDATE: 20-Aug-1961 , 60  yrs. old GENDER: male ENDOSCOPIST: Ladene Artist, MD, [facility] REFERRED TI:[id number] Aguiar, M.D. PROCEDURE DATE:  10/21/2014 PROCEDURE:   Colonoscopy with biopsy First Screening Colonoscopy - Avg.  risk and is 50 yrs.  old or older - No.  Prior Negative Screening - Now for repeat screening. N/A  History of Adenoma - Now for follow-up colonoscopy & has been > or = to 3 yrs.  N/A  Polyps Removed Today? No.  Polyps Removed Today? No.  Recommend repeat exam, <10 yrs? Polyps Removed Today? No.  Recommend repeat exam, <10 yrs? No. ASA CLASS:   Class II INDICATIONS:hematochezia and unexplained diarrhea. MEDICATIONS: Monitored anesthesia care and Propofol 400 mg IV DESCRIPTION OF PROCEDURE:   After the risks benefits and alternatives of the procedure were thoroughly explained, informed consent was obtained.  The digital rectal exam revealed no abnormalities of the rectum.   The LB [id number]  endoscope was introduced through the anus and advanced to the cecum, which was identified by both the appendix and ileocecal valve. No adverse events experienced.   The quality of the prep was good, using MoviPrep  The instrument was then slowly withdrawn as the colon was fully examined.  COLON FINDINGS: A circumferential patch of ulcerated abnormal mucosa was found in the descending colon and at the splenic flexure. Segmental colitis from 40-70 cm. The mucosa was friable and nodular.  Multiple biopsies of the area were performed.  At the distal aspect of the colitis there was  4 cm area of firm nodular mucosal that was biopsied separately. The examination was otherwise normal.  Retroflexed views revealed no abnormalities. The time to cecum=2 minutes 23 seconds.  Withdrawal time=15 minutes 34 seconds. The scope was withdrawn and the  procedure completed. COMPLICATIONS: There were no immediate complications.  ENDOSCOPIC IMPRESSION: 1.   Circumferential abnormal mucosa in the descending colon and at the splenic flexure; multiple biopsies performed 2.   The examination was otherwise normal  RECOMMENDATIONS: 1.  Await pathology results 2.  Call office for follow-up appointment in 1 week  eSigned:  Ladene Artist, MD, [facility] 10/21/2014 8:41 AM

## 2014-10-22 ENCOUNTER — Telehealth: Payer: Self-pay | Admitting: *Deleted

## 2014-10-22 NOTE — Telephone Encounter (Signed)
  Follow up Call-  Call back number 10/21/2014  Post procedure Call Back phone  # [phone]  Permission to leave phone message Yes     Patient questions:  Do you have a fever, pain , or abdominal swelling? No. Pain Score  0 *  Have you tolerated food without any problems? Yes.    Have you been able to return to your normal activities? Yes.    Do you have any questions about your discharge instructions: Diet   No. Medications  No. Follow up visit  No.  Do you have questions or concerns about your Care? No.  Actions: * If pain score is 4 or above: No action needed, pain <4.

## 2014-10-23 LAB — STOOL CULTURE

## 2014-10-27 ENCOUNTER — Encounter: Payer: Self-pay | Admitting: Gastroenterology

## 2014-10-27 ENCOUNTER — Ambulatory Visit (INDEPENDENT_AMBULATORY_CARE_PROVIDER_SITE_OTHER): Payer: BLUE CROSS/BLUE SHIELD | Admitting: Gastroenterology

## 2014-10-27 VITALS — BP 140/90 | HR 92 | Ht 72.0 in | Wt 232.0 lb

## 2014-10-27 DIAGNOSIS — K529 Noninfective gastroenteritis and colitis, unspecified: Secondary | ICD-10-CM

## 2014-10-27 NOTE — Patient Instructions (Addendum)
Discontinue Cipro and Flagyl.   Call in one month if your symptoms have not improved and resolved.   Thank you for choosing me and [facility].  Pricilla Riffle. Dagoberto Ligas., MD., Marval Regal   cc: Rolan Lipa, MD

## 2014-10-27 NOTE — Progress Notes (Signed)
    History of Present Illness: This is a 54 year old male returning for follow-up for severe segmental colitis. Biopsies were consistent with ischemic colitis. The most distal aspect of the segmental colitis was significantly indurated and I could not be completely confident there wasn't another disease process involved. His rectal bleeding has stopped completely. His abdominal pain is completely resolved. He has mild loose stools. He is making a concerted effort to maintain adequate hydration.  Current Medications, Allergies, Past Medical History, Past Surgical History, Family History and Social History were reviewed in [employer] record.  Physical Exam: General: Well developed , well nourished, no acute distress Head: Normocephalic and atraumatic Eyes:  sclerae anicteric, EOMI Ears: Normal auditory acuity Mouth: No deformity or lesions Lungs: Clear throughout to auscultation Heart: Regular rate and rhythm; no murmurs, rubs or bruits Abdomen: Soft, non tender and non distended. No masses, hepatosplenomegaly or hernias noted. Normal Bowel sounds Musculoskeletal: Symmetrical with no gross deformities  Pulses:  Normal pulses noted Extremities: No clubbing, cyanosis, edema or deformities noted Neurological: Alert oriented x 4, grossly nonfocal Psychological:  Alert and cooperative. Normal mood and affect  Assessment and Recommendations:  1. Severe segmental colitis. Biopsies consistent with ischemic colitis. Discontinue antibiotics. Discontinue decongestants. Currently is taking Allegra-D. Continue supportive management. We had a long discussion about maintaining adequate hydration long-term. Colonoscopy in 6 months to document healing and to evaluate for any other underlying disease processes in the involved segment of colon.

## 2015-02-22 ENCOUNTER — Ambulatory Visit (INDEPENDENT_AMBULATORY_CARE_PROVIDER_SITE_OTHER): Payer: BLUE CROSS/BLUE SHIELD | Admitting: Emergency Medicine

## 2015-02-22 VITALS — BP 146/86 | HR 80 | Temp 97.9°F | Resp 16 | Ht 72.0 in | Wt 223.6 lb

## 2015-02-22 DIAGNOSIS — M7552 Bursitis of left shoulder: Secondary | ICD-10-CM | POA: Diagnosis not present

## 2015-02-22 DIAGNOSIS — I781 Nevus, non-neoplastic: Secondary | ICD-10-CM

## 2015-02-22 DIAGNOSIS — B079 Viral wart, unspecified: Secondary | ICD-10-CM

## 2015-02-22 DIAGNOSIS — M7551 Bursitis of right shoulder: Secondary | ICD-10-CM

## 2015-02-22 MED ORDER — NAPROXEN SODIUM 550 MG PO TABS: 550.0000 mg | ORAL_TABLET | Freq: Two times a day (BID) | ORAL | Status: DC

## 2015-02-22 NOTE — Progress Notes (Signed)
[facility] [address], [place] [address] [phone]  Date:  02/22/2015   Name:  Billy Keller.   DOB:  [birth year]   MRN:  [id number]  PCP:  Robyne Peers., MD    Chief Complaint: Shoulder Pain; Wart Removal; and Mole Removal   History of Present Illness:  Billy Keller. is a 54 y.o. very pleasant male patient who presents with the following:  Patient has a chronic pain in both shoulder.  No history of injury.  Attributes to working out with weights while younger Also carries a 50 pound tool kit. Pain constant and worse with heavy lifting. No swelling or erythema.  No crepitus Has a nevus on scalp and a wart on right index No improvement with over the counter medications or other home remedies.  Denies other complaint or health concern today.   There are no active problems to display for this patient.   Past Medical History  Diagnosis Date  . Allergy   . Ischemic colitis     History reviewed. No pertinent past surgical history.  History  Substance Use Topics  . Smoking status: Never Smoker   . Smokeless tobacco: Current User    Types: Snuff  . Alcohol Use: Yes     Comment: 7 drinks per week    Family History  Problem Relation Age of Onset  . Congestive Heart Failure Father   . Colon cancer Neg Hx   . Stomach cancer Neg Hx     No Known Allergies  Medication list has been reviewed and updated.  Current Outpatient Prescriptions on File Prior to Visit  Medication Sig Dispense Refill  . fexofenadine-pseudoephedrine (ALLEGRA-D 24) 180-240 MG per 24 hr tablet Take 1 tablet by mouth daily. 30 tablet 2  . ciprofloxacin (CIPRO) 500 MG tablet Take 1 tablet (500 mg total) by mouth 2 (two) times daily. (Patient not taking: Reported on 02/22/2015) 20 tablet 0  . HYDROcodone-acetaminophen (NORCO/VICODIN) 5-325 MG per tablet Take 1 tablet by mouth every 6 (six) hours as needed. (Patient not taking: Reported on 02/22/2015) 30 tablet 0  .  metroNIDAZOLE (FLAGYL) 500 MG tablet Take 1 tablet (500 mg total) by mouth 3 (three) times daily. (Patient not taking: Reported on 02/22/2015) 21 tablet 0  . ondansetron (ZOFRAN) 8 MG tablet Take 1 tablet (8 mg total) by mouth every 8 (eight) hours as needed for nausea or vomiting. (Patient not taking: Reported on 02/22/2015) 20 tablet 0   No current facility-administered medications on file prior to visit.    Review of Systems:  Review of Systems  Constitutional: Negative for fever, chills and fatigue.  HENT: Negative for congestion, ear pain, hearing loss, postnasal drip, rhinorrhea and sinus pressure.   Eyes: Negative for discharge and redness.  Respiratory: Negative for cough, shortness of breath and wheezing.   Cardiovascular: Negative for chest pain and leg swelling.  Gastrointestinal: Negative for nausea, vomiting, abdominal pain, constipation and blood in stool.  Genitourinary: Negative for dysuria, urgency and frequency.  Musculoskeletal: Negative for neck stiffness.  Skin: Negative for rash.  Neurological: Negative for seizures, weakness and headaches.   Physical Examination: Filed Vitals:   02/22/15 0842  BP: 146/86  Pulse: 80  Temp: 97.9 F (36.6 C)  Resp: 16   Filed Vitals:   02/22/15 0842  Height: 6' (1.829 m)  Weight: 223 lb 9.6 oz (101.424 kg)   Body mass index is 30.32 kg/(m^2). Ideal Body Weight: Weight in (lb) to have  BMI = 25: 183.9   GEN: WDWN, NAD, Non-toxic, Alert & Oriented x 3 HEENT: Atraumatic, Normocephalic.  Ears and Nose: No external deformity. EXTR: No clubbing/cyanosis/edema NEURO: Normal gait.  PSYCH: Normally interactive. Conversant. Not depressed or anxious appearing.  Calm demeanor.  SKIN:  Nevus less than 1 cm left parietal  Wart right index tip SHOULDERS:  Posterior shoulder tenderness.  Normal strength.  Neuro intact.  Full ROM.    Assessment and Plan: 1. Wart Cryotherapy 30 seconds  2. Nevus, non-neoplastic 45 second  cryotherapy  3. Bursitis of shoulder, left Local heat - naproxen sodium (ANAPROX DS) 550 MG tablet; Take 1 tablet (550 mg total) by mouth 2 (two) times daily with a meal.  Dispense: 60 tablet; Refill: 2  4. Bursitis, shoulder, right   - naproxen sodium (ANAPROX DS) 550 MG tablet; Take 1 tablet (550 mg total) by mouth 2 (two) times daily with a meal.  Dispense: 60 tablet; Refill: 2    Signed Ellison Carwin, MD

## 2015-02-22 NOTE — Patient Instructions (Signed)
Bursitis °Bursitis is a swelling and soreness (inflammation) of a fluid-filled sac (bursa) that overlies and protects a joint. It can be caused by injury, overuse of the joint, arthritis or infection. The joints most likely to be affected are the elbows, shoulders, hips and knees. °HOME CARE INSTRUCTIONS  °· Apply ice to the affected area for 15-20 minutes each hour while awake for 2 days. Put the ice in a plastic bag and place a towel between the bag of ice and your skin. °· Rest the injured joint as much as possible, but continue to put the joint through a full range of motion, 4 times per day. (The shoulder joint especially becomes rapidly "frozen" if not used.) When the pain lessens, begin normal slow movements and usual activities. °· Only take over-the-counter or prescription medicines for pain, discomfort or fever as directed by your caregiver. °· Your caregiver may recommend draining the bursa and injecting medicine into the bursa. This may help the healing process. °· Follow all instructions for follow-up with your caregiver. This includes any orthopedic referrals, physical therapy and rehabilitation. Any delay in obtaining necessary care could result in a delay or failure of the bursitis to heal and chronic pain. °SEEK IMMEDIATE MEDICAL CARE IF:  °· Your pain increases even during treatment. °· You develop an oral temperature above 102° F (38.9° C) and have heat and inflammation over the involved bursa. °MAKE SURE YOU:  °· Understand these instructions. °· Will watch your condition. °· Will get help right away if you are not doing well or get worse. °Document Released: 09/21/2000 Document Revised: 12/17/2011 Document Reviewed: 12/14/2013 °ExitCare® Patient Information ©2015 [employer]. This information is not intended to replace advice given to you by your health care provider. Make sure you discuss any questions you have with your health care provider. ° °

## 2015-02-25 ENCOUNTER — Encounter: Payer: Self-pay | Admitting: Gastroenterology

## 2015-04-04 ENCOUNTER — Ambulatory Visit (INDEPENDENT_AMBULATORY_CARE_PROVIDER_SITE_OTHER): Payer: BLUE CROSS/BLUE SHIELD | Admitting: Physician Assistant

## 2015-04-04 VITALS — BP 122/84 | HR 80 | Temp 97.7°F | Resp 18 | Ht 72.0 in | Wt 226.0 lb

## 2015-04-04 DIAGNOSIS — R1013 Epigastric pain: Secondary | ICD-10-CM | POA: Diagnosis not present

## 2015-04-04 DIAGNOSIS — R110 Nausea: Secondary | ICD-10-CM

## 2015-04-04 DIAGNOSIS — K921 Melena: Secondary | ICD-10-CM

## 2015-04-04 LAB — POCT CBC
GRANULOCYTE PERCENT: 73.7 % (ref 37–80)
HEMATOCRIT: 45.7 % (ref 43.5–53.7)
Hemoglobin: 15.2 g/dL (ref 14.1–18.1)
Lymph, poc: 1.1 (ref 0.6–3.4)
MCH: 30.6 pg (ref 27–31.2)
MCHC: 33.2 g/dL (ref 31.8–35.4)
MCV: 92.2 fL (ref 80–97)
MID (cbc): 0.7 (ref 0–0.9)
MPV: 6.7 fL (ref 0–99.8)
PLATELET COUNT, POC: 263 10*3/uL (ref 142–424)
POC Granulocyte: 5.2 (ref 2–6.9)
POC LYMPH %: 16.1 % (ref 10–50)
POC MID %: 10.2 %M (ref 0–12)
RBC: 4.95 M/uL (ref 4.69–6.13)
RDW, POC: 13.4 %
WBC: 7 10*3/uL (ref 4.6–10.2)

## 2015-04-04 LAB — COMPLETE METABOLIC PANEL WITH GFR
ALK PHOS: 83 U/L (ref 39–117)
ALT: 54 U/L — ABNORMAL HIGH (ref 0–53)
AST: 36 U/L (ref 0–37)
Albumin: 4.1 g/dL (ref 3.5–5.2)
BUN: 15 mg/dL (ref 6–23)
CHLORIDE: 101 meq/L (ref 96–112)
CO2: 27 meq/L (ref 19–32)
Calcium: 9.2 mg/dL (ref 8.4–10.5)
Creat: 1.02 mg/dL (ref 0.50–1.35)
GFR, Est Non African American: 84 mL/min
Glucose, Bld: 115 mg/dL — ABNORMAL HIGH (ref 70–99)
Potassium: 4.1 mEq/L (ref 3.5–5.3)
Sodium: 139 mEq/L (ref 135–145)
TOTAL PROTEIN: 7 g/dL (ref 6.0–8.3)
Total Bilirubin: 0.4 mg/dL (ref 0.2–1.2)

## 2015-04-04 LAB — POC HEMOCCULT BLD/STL (OFFICE/1-CARD/DIAGNOSTIC): FECAL OCCULT BLD: POSITIVE — AB

## 2015-04-04 LAB — LIPASE: Lipase: 20 U/L (ref 0–75)

## 2015-04-04 MED ORDER — SUCRALFATE 1 GM/10ML PO SUSP: 1.0000 g | Freq: Three times a day (TID) | ORAL | Status: DC

## 2015-04-04 MED ORDER — LANSOPRAZOLE 30 MG PO CPDR: 30.0000 mg | DELAYED_RELEASE_CAPSULE | Freq: Every day | ORAL | Status: DC

## 2015-04-04 NOTE — Patient Instructions (Addendum)
Decrease coffee or at least not on an empty stomach.  Recheck stool for blood about 2-3 weeks after your pain has stopped.  Use the carafate (the liquid medication) for 2 weeks) and the prevacid for at least a month.  Stop the NSAIDs (naproxen and do not use motrin either).  Gastritis, Adult Gastritis is soreness and swelling (inflammation) of the lining of the stomach. Gastritis can develop as a sudden onset (acute) or long-term (chronic) condition. If gastritis is not treated, it can lead to stomach bleeding and ulcers. CAUSES  Gastritis occurs when the stomach lining is weak or damaged. Digestive juices from the stomach then inflame the weakened stomach lining. The stomach lining may be weak or damaged due to viral or bacterial infections. One common bacterial infection is the Helicobacter pylori infection. Gastritis can also result from excessive alcohol consumption, taking certain medicines, or having too much acid in the stomach.  SYMPTOMS  In some cases, there are no symptoms. When symptoms are present, they may include:  Pain or a burning sensation in the upper abdomen.  Nausea.  Vomiting.  An uncomfortable feeling of fullness after eating. DIAGNOSIS  Your caregiver may suspect you have gastritis based on your symptoms and a physical exam. To determine the cause of your gastritis, your caregiver may perform the following:  Blood or stool tests to check for the H pylori bacterium.  Gastroscopy. A thin, flexible tube (endoscope) is passed down the esophagus and into the stomach. The endoscope has a light and camera on the end. Your caregiver uses the endoscope to view the inside of the stomach.  Taking a tissue sample (biopsy) from the stomach to examine under a microscope. TREATMENT  Depending on the cause of your gastritis, medicines may be prescribed. If you have a bacterial infection, such as an H pylori infection, antibiotics may be given. If your gastritis is caused by too much  acid in the stomach, H2 blockers or antacids may be given. Your caregiver may recommend that you stop taking aspirin, ibuprofen, or other nonsteroidal anti-inflammatory drugs (NSAIDs). HOME CARE INSTRUCTIONS  Only take over-the-counter or prescription medicines as directed by your caregiver.  If you were given antibiotic medicines, take them as directed. Finish them even if you start to feel better.  Drink enough fluids to keep your urine clear or pale yellow.  Avoid foods and drinks that make your symptoms worse, such as:  Caffeine or alcoholic drinks.  Chocolate.  Peppermint or mint flavorings.  Garlic and onions.  Spicy foods.  Citrus fruits, such as oranges, lemons, or limes.  Tomato-based foods such as sauce, chili, salsa, and pizza.  Fried and fatty foods.  Eat small, frequent meals instead of large meals. SEEK IMMEDIATE MEDICAL CARE IF:   You have black or dark red stools.  You vomit blood or material that looks like coffee grounds.  You are unable to keep fluids down.  Your abdominal pain gets worse.  You have a fever.  You do not feel better after 1 week.  You have any other questions or concerns. MAKE SURE YOU:  Understand these instructions.  Will watch your condition.  Will get help right away if you are not doing well or get worse. Document Released: 09/18/2001 Document Revised: 03/25/2012 Document Reviewed: 11/07/2011 [facility] Patient Information 2015 [place], [place]. This information is not intended to replace advice given to you by your health care provider. Make sure you discuss any questions you have with your health care provider.

## 2015-04-04 NOTE — Progress Notes (Signed)
Billy Keller.  MRN: [id number] DOB: Jun 12, 1961  Subjective:  Pt presents to clinic with epigastric pain for the last 5 days that does not seem to be changing.  He has symptoms of similar pain the past associated with stress and increase in ETOH intake and tobacco use but in the past it has always started after these behaviors and stopped within 24h.  He has never had this in the past not associated with these behaviors.  This time his pain started 5 nights ago and then he missed worked from the pain.  After 72h of pain he had a stressful day at work and then he had 2 nights of binge ETOH drinking and increase use of oral tobacco and his pain got worse but it is 48h later and it is no better.  It is definitely worse with intake of coffee or spicy foods.  He is not sure if laying down makes it worse.  He has tried Tagement and other OTC heartburn relief agents without any relief.  He had a colonoscopy in Jan and was diagnosis with colitis but that has all resolved.  He has noticed no black, tarry or blood stools.  He was started on Naproxen which he has been taking bid for the last about 6 weeks for his shoulder pain and it is helping that pain a lot.  He uses oral tobacco daily but he does not drink daily.  There are no active problems to display for this patient.   Current Outpatient Prescriptions on File Prior to Visit  Medication Sig Dispense Refill  . naproxen sodium (ANAPROX DS) 550 MG tablet Take 1 tablet (550 mg total) by mouth 2 (two) times daily with a meal. 60 tablet 2  . ondansetron (ZOFRAN) 8 MG tablet Take 1 tablet (8 mg total) by mouth every 8 (eight) hours as needed for nausea or vomiting. 20 tablet 0   No current facility-administered medications on file prior to visit.    No Known Allergies  Review of Systems  Constitutional: Positive for chills. Negative for fever.  HENT: Negative.   Gastrointestinal: Positive for nausea and abdominal pain (epigastric region). Negative for  vomiting, diarrhea, constipation and blood in stool.       No heartburn symptoms of chest burning, burping or sour taste in mouth  Genitourinary: Negative.    Objective:  BP 122/84 mmHg  Pulse 80  Temp(Src) 97.7 F (36.5 C) (Oral)  Resp 18  Ht 6' (1.829 m)  Wt 226 lb (102.513 kg)  BMI 30.64 kg/m2  SpO2 98%  Physical Exam  Constitutional: He is oriented to person, place, and time and well-developed, well-nourished, and in no distress.  HENT:  Head: Normocephalic and atraumatic.  Right Ear: External ear normal.  Left Ear: External ear normal.  Eyes: Conjunctivae are normal.  Neck: Normal range of motion.  Cardiovascular: Normal rate, regular rhythm and normal heart sounds.   No murmur heard. Pulmonary/Chest: Effort normal and breath sounds normal. He has no wheezes.  Abdominal: Soft. There is tenderness (epigastric and slight LUQ near epigastrium). There is no rebound and no guarding.  Genitourinary: Rectal exam shows no external hemorrhoid. Guaiac positive stool.  Neurological: He is alert and oriented to person, place, and time. Gait normal.  Skin: Skin is warm and dry.  Psychiatric: Mood, memory, affect and judgment normal.   Results for orders placed or performed in visit on 04/04/15  POCT CBC  Result Value Ref Range   WBC 7.0 4.6 -  10.2 K/uL   Lymph, poc 1.1 0.6 - 3.4   POC LYMPH PERCENT 16.1 10 - 50 %L   MID (cbc) 0.7 0 - 0.9   POC MID % 10.2 0 - 12 %M   POC Granulocyte 5.2 2 - 6.9   Granulocyte percent 73.7 37 - 80 %G   RBC 4.95 4.69 - 6.13 M/uL   Hemoglobin 15.2 14.1 - 18.1 g/dL   HCT, POC 45.7 43.5 - 53.7 %   MCV 92.2 80 - 97 fL   MCH, POC 30.6 27 - 31.2 pg   MCHC 33.2 31.8 - 35.4 g/dL   RDW, POC 13.4 %   Platelet Count, POC 263 142 - 424 K/uL   MPV 6.7 0 - 99.8 fL  POC Hemoccult Bld/Stl (1-Cd Office Dx)  Result Value Ref Range   Card #1 Date     Fecal Occult Blood, POC Positive (A) Negative    Assessment and Plan :  Abdominal pain, epigastric - Plan:  POCT CBC, COMPLETE METABOLIC PANEL WITH GFR, Lipase, POC Hemoccult Bld/Stl (1-Cd Office Dx), sucralfate (CARAFATE) 1 GM/10ML suspension, lansoprazole (PREVACID) 30 MG capsule  Nausea without vomiting  Blood in stool - Plan: POC Hemoccult Bld/Stl (3-Cd Home Screen)   I am concerned that the patient believes this is all related to his ETOH intake because his pain started prior to his ETOH binge and he has been on NSAIDs for 6 weeks.  He will stop the NSAID and only use Tylenol for his pain. I offered an ortho referral for injections/PT but he declined stated that he will do home PT exercises that he has been in the past.  We will start him on carafate and PPI.  I did not do a h pylori test on him today because his symptoms are so intermittent and have always been related to [place] intake in the past and his symptoms were definitely aggravated by his recent ETOH binge.  I do think he has gastritis and he will change his diet to not aggravate his condition.  He will recheck stool hemoccults in 2-3 weeks and if there is still blood present he will go to GI for endoscopy.  He should be mindful of NSAID use in the future and if he starts back he should make sure he never takes them on an empty stomach.  He will contact me if his pain does not resolve in about a week and then he will need to go to GI for endoscopy.  Windell Hummingbird PA-C  [facility] 04/04/2015 11:15 AM

## 2015-08-29 ENCOUNTER — Ambulatory Visit (INDEPENDENT_AMBULATORY_CARE_PROVIDER_SITE_OTHER): Payer: Commercial Managed Care - PPO | Admitting: Urgent Care

## 2015-08-29 VITALS — BP 132/80 | HR 86 | Temp 98.3°F | Resp 17 | Ht 71.5 in | Wt 234.0 lb

## 2015-08-29 DIAGNOSIS — R0981 Nasal congestion: Secondary | ICD-10-CM

## 2015-08-29 DIAGNOSIS — R51 Headache: Secondary | ICD-10-CM | POA: Diagnosis not present

## 2015-08-29 DIAGNOSIS — J069 Acute upper respiratory infection, unspecified: Secondary | ICD-10-CM | POA: Diagnosis not present

## 2015-08-29 DIAGNOSIS — R059 Cough, unspecified: Secondary | ICD-10-CM

## 2015-08-29 DIAGNOSIS — J029 Acute pharyngitis, unspecified: Secondary | ICD-10-CM | POA: Diagnosis not present

## 2015-08-29 DIAGNOSIS — G4700 Insomnia, unspecified: Secondary | ICD-10-CM

## 2015-08-29 DIAGNOSIS — R519 Headache, unspecified: Secondary | ICD-10-CM

## 2015-08-29 DIAGNOSIS — R05 Cough: Secondary | ICD-10-CM

## 2015-08-29 MED ORDER — AZITHROMYCIN 250 MG PO TABS: ORAL_TABLET | ORAL | Status: DC

## 2015-08-29 MED ORDER — TEMAZEPAM 15 MG PO CAPS: 15.0000 mg | ORAL_CAPSULE | Freq: Every evening | ORAL | Status: DC | PRN

## 2015-08-29 NOTE — Progress Notes (Signed)
    MRN: [id number] DOB: Feb 03, 1961  Subjective:   Billy Keller. is a 54 y.o. male presenting for chief complaint of Sinusitis and Medication Refill  Sinus problem - Reports more than 1 week history nasal and head congestion, sinus headaches, post-nasal drainage, ear fullness, dizziness, sore throat, chest congestion, dry cough in the morning, mild shob, wheezing. Has a history of frequent sinus infections in the past. Has used thera-flu, back off on Claritin D which he normally takes for allergies. Thought he was getting better and then got really sick again in the past 2 days with the above symptoms. Denies smoking cigarettes.  Medication refill - takes Restoril to help with anxiety and sleep. Has had trouble in the past with sleep. Take it as needed, 1 pill only.  Denies any other aggravating or relieving factors, no other questions or concerns.  Billy Keller has a current medication list which includes the following prescription(s): temazepam. Also has No Known Allergies.  Billy Keller  has a past medical history of Allergy and Ischemic colitis ([place]). Also  has no past surgical history on file.  Objective:   Vitals: BP 132/80 mmHg  Pulse 86  Temp(Src) 98.3 F (36.8 C) (Oral)  Resp 17  Ht 5' 11.5" (1.816 m)  Wt 234 lb (106.142 kg)  BMI 32.19 kg/m2  SpO2 98%  Physical Exam  Constitutional: He is oriented to person, place, and time. He appears well-developed and well-nourished.  HENT:  TM's flat bilaterally, no effusions or erythema. Nasal turbinates erythematous without sinus tenderness. Postnasal drip present, without oropharyngeal exudates, erythema or abscesses.  Eyes: Right eye exhibits no discharge. Left eye exhibits no discharge. No scleral icterus.  Neck: Normal range of motion. Neck supple.  Cardiovascular: Normal rate, regular rhythm and intact distal pulses.  Exam reveals no gallop and no friction rub.   No murmur heard. Pulmonary/Chest: No respiratory distress. He has no  wheezes. He has no rales.  Lymphadenopathy:    He has cervical adenopathy.  Neurological: He is alert and oriented to person, place, and time.  Skin: Skin is warm and dry. No rash noted. No erythema. No pallor.   Assessment and Plan :   1. Acute upper respiratory infection 2. Nasal congestion 3. Sinus headache 4. Cough 5. Sore throat - Will cover for infectious process with Azithromycin. Recommended supportive care and restarting Claritin D. Recheck in 1 week if no improvement.  6. Insomnia - Refilled Restoril  Jaynee Eagles, PA-C [facility] [phone] 08/29/2015 8:32 AM

## 2015-08-29 NOTE — Patient Instructions (Signed)
Upper Respiratory Infection, Adult Most upper respiratory infections (URIs) are a viral infection of the air passages leading to the lungs. A URI affects the nose, throat, and upper air passages. The most common type of URI is nasopharyngitis and is typically referred to as "the common cold." URIs run their course and usually go away on their own. Most of the time, a URI does not require medical attention, but sometimes a bacterial infection in the upper airways can follow a viral infection. This is called a secondary infection. Sinus and middle ear infections are common types of secondary upper respiratory infections. Bacterial pneumonia can also complicate a URI. A URI can worsen asthma and chronic obstructive pulmonary disease (COPD). Sometimes, these complications can require emergency medical care and may be life threatening.  CAUSES Almost all URIs are caused by viruses. A virus is a type of germ and can spread from one person to another.  RISKS FACTORS You may be at risk for a URI if:   You smoke.   You have chronic heart or lung disease.  You have a weakened defense (immune) system.   You are very young or very old.   You have nasal allergies or asthma.  You work in crowded or poorly ventilated areas.  You work in health care facilities or schools. SIGNS AND SYMPTOMS  Symptoms typically develop 2-3 days after you come in contact with a cold virus. Most viral URIs last 7-10 days. However, viral URIs from the influenza virus (flu virus) can last 14-18 days and are typically more severe. Symptoms may include:   Runny or stuffy (congested) nose.   Sneezing.   Cough.   Sore throat.   Headache.   Fatigue.   Fever.   Loss of appetite.   Pain in your forehead, behind your eyes, and over your cheekbones (sinus pain).  Muscle aches.  DIAGNOSIS  Your health care provider may diagnose a URI by:  Physical exam.  Tests to check that your symptoms are not due to  another condition such as:  Strep throat.  Sinusitis.  Pneumonia.  Asthma. TREATMENT  A URI goes away on its own with time. It cannot be cured with medicines, but medicines may be prescribed or recommended to relieve symptoms. Medicines may help:  Reduce your fever.  Reduce your cough.  Relieve nasal congestion. HOME CARE INSTRUCTIONS   Take medicines only as directed by your health care provider.   Gargle warm saltwater or take cough drops to comfort your throat as directed by your health care provider.  Use a warm mist humidifier or inhale steam from a shower to increase air moisture. This may make it easier to breathe.  Drink enough fluid to keep your urine clear or pale yellow.   Eat soups and other clear broths and maintain good nutrition.   Rest as needed.   Return to work when your temperature has returned to normal or as your health care provider advises. You may need to stay home longer to avoid infecting others. You can also use a face mask and careful hand washing to prevent spread of the virus.  Increase the usage of your inhaler if you have asthma.   Do not use any tobacco products, including cigarettes, chewing tobacco, or electronic cigarettes. If you need help quitting, ask your health care provider. PREVENTION  The best way to protect yourself from getting a cold is to practice good hygiene.   Avoid oral or hand contact with people with cold   symptoms.   Wash your hands often if contact occurs.  There is no clear evidence that vitamin C, vitamin E, echinacea, or exercise reduces the chance of developing a cold. However, it is always recommended to get plenty of rest, exercise, and practice good nutrition.  SEEK MEDICAL CARE IF:   You are getting worse rather than better.   Your symptoms are not controlled by medicine.   You have chills.  You have worsening shortness of breath.  You have brown or red mucus.  You have yellow or brown nasal  discharge.  You have pain in your face, especially when you bend forward.  You have a fever.  You have swollen neck glands.  You have pain while swallowing.  You have white areas in the back of your throat. SEEK IMMEDIATE MEDICAL CARE IF:   You have severe or persistent:  Headache.  Ear pain.  Sinus pain.  Chest pain.  You have chronic lung disease and any of the following:  Wheezing.  Prolonged cough.  Coughing up blood.  A change in your usual mucus.  You have a stiff neck.  You have changes in your:  Vision.  Hearing.  Thinking.  Mood. MAKE SURE YOU:   Understand these instructions.  Will watch your condition.  Will get help right away if you are not doing well or get worse.   This information is not intended to replace advice given to you by your health care provider. Make sure you discuss any questions you have with your health care provider.   Document Released: 03/20/2001 Document Revised: 02/08/2015 Document Reviewed: 12/30/2013 Elsevier Interactive Patient Education 2016 Else[employer]    Temazepam tablets or capsules What is this medicine? TEMAZEPAM (te MAZ e pam) is a benzodiazepine. It is used to help you to fall asleep and sleep through the night. This medicine may be used for other purposes; ask your health care provider or pharmacist if you have questions. What should I tell my health care provider before I take this medicine? They need to know if you have any of these conditions: -an alcohol or drug abuse problem -bipolar disorder, depression, psychosis or other mental health condition -kidney disease -liver disease -lung or breathing disease -myasthenia gravis -Parkinson's disease -porphyria -seizures or a history of seizures -suicidal thoughts -an unusual or allergic reaction to temazepam, other benzodiazepines, other medicines, foods, dyes, or preservatives -pregnant or trying to get pregnant -breast-feeding How should I  use this medicine? Take this medicine by mouth. It is only for use at bedtime. Follow the directions on the prescription label. Swallow the tablets or capsules with a drink of water. If it upsets your stomach, take it with food or milk. Do not take your medicine more often than directed. Do not stop taking except on the advice of your doctor or health care professional. A special MedGuide will be given to you by the pharmacist with each prescription and refill. Be sure to read this information carefully each time. Talk to your pediatrician regarding the use of this medicine in children. Special care may be needed. Overdosage: If you think you have taken too much of this medicine contact a poison control center or emergency room at once. NOTE: This medicine is only for you. Do not share this medicine with others. What if I miss a dose? If you miss a dose, take it as soon as you can. If it is almost time for your next dose, take only that dose. Do not  take double or extra doses. What may interact with this medicine? -barbiturate medicines for inducing sleep or treating seizures, like phenobarbital -herbal or dietary supplements, like kava kava, melatonin, or valerian -medicines for anxiety or sleeping problems, like alprazolam, diazepam, lorazepam or triazolam -medicines for depression, mental problems or psychiatric disturbances -phenytoin -prescription pain medicines This list may not describe all possible interactions. Give your health care provider a list of all the medicines, herbs, non-prescription drugs, or dietary supplements you use. Also tell them if you smoke, drink alcohol, or use illegal drugs. Some items may interact with your medicine. What should I watch for while using this medicine? Visit your doctor or health care professional for regular checks on your progress. This medicine is for short-term periods of use. If sleep medicine is taken every night for a long time it may no longer  help you to sleep. Also, your body can become dependent on this medicine. If you have been taking it regularly for some time, do not stop suddenly or you may get severe side effects. Ask your doctor or health care professional for advice. Keep a regular sleep schedule by going to bed at about the same time nightly. Avoid caffeine-containing drinks in the evening hours. Talk to your doctor if you still have trouble sleeping within 7 to 10 days of using this medicine. This may mean there is another cause for your sleep problems. After taking this medicine for sleep, you may get up out of bed while not being fully awake and do an activity that you do not know you are doing. The next morning, you may have no memory of the event. Activities such as driving a car ("sleep-driving"), making and eating food, talking on the phone, sexual activity, and sleep-walking have been reported. Call your doctor right away if you find out you have done any of these activities. Do not take this medicine if you drink alcohol or have taken another medicine for sleep, since your risk of doing these sleep-related activities will be increased. Do not take this medicine unless you are able to stay in bed for a full night (7 to 8 hours) before you must be active again. You may have a decrease in mental alertness the day after use, even if you feel that you are fully awake. Tell your doctor if you will need to perform activities requiring full alertness, such as driving, the next day. Do not stand or sit up quickly after taking this medicine, especially if you are an older patient. This reduces the risk of dizzy or fainting spells. If you or your family notice any changes in your behavior, such as new or worsening depression, thoughts of harming yourself, anxiety, other unusual or disturbing thoughts, or memory loss, call your doctor right away. What side effects may I notice from receiving this medicine? Side effects that you should report  to your doctor or health care professional as soon as possible: -allergic reactions like skin rash, itching or hives, swelling of the face, lips, or tongue -confusion -depression -feeling faint or lightheaded -hallucinations -memory loss -mood changes, excitability or aggressive behavior -muscle cramps -problems with balance, speaking, walking -suicidal thoughts -tremors -unusually weak or tired -unusual activities while asleep like driving, eating, making phone calls Side effects that usually do not require medical attention (report to your doctor or health care professional if they continue or are bothersome): -dizziness, drowsiness -headache -increased dreaming -nausea, vomiting This list may not describe all possible side effects. Call your  doctor for medical advice about side effects. You may report side effects to FDA at 1-800-FDA-1088. Where should I keep my medicine? Keep out of the reach of children. This medicine can be abused. Keep your medicine in a safe place to protect it from theft. Do not share this medicine with anyone. Selling or giving away this medicine is dangerous and against the law. This medicine may cause accidental overdose and death if taken by other adults, children, or pets. Mix any unused medicine with a substance like cat litter or coffee grounds. Then throw the medicine away in a sealed container like a sealed bag or a coffee can with a lid. Do not use the medicine after the expiration date. Store at room temperature below 30 degrees C (86 degrees F). Protect from light. Keep container tightly closed. NOTE: This sheet is a summary. It may not cover all possible information. If you have questions about this medicine, talk to your doctor, pharmacist, or health care provider.    2016, Elsevier/Gold Standard. (2014-06-15 15:40:22)

## 2015-09-22 ENCOUNTER — Ambulatory Visit (INDEPENDENT_AMBULATORY_CARE_PROVIDER_SITE_OTHER): Payer: Commercial Managed Care - PPO | Admitting: Family Medicine

## 2015-09-22 VITALS — BP 140/88 | HR 76 | Temp 98.6°F | Wt 233.0 lb

## 2015-09-22 DIAGNOSIS — J302 Other seasonal allergic rhinitis: Secondary | ICD-10-CM

## 2015-09-22 DIAGNOSIS — R05 Cough: Secondary | ICD-10-CM | POA: Diagnosis not present

## 2015-09-22 DIAGNOSIS — G4700 Insomnia, unspecified: Secondary | ICD-10-CM | POA: Diagnosis not present

## 2015-09-22 DIAGNOSIS — R059 Cough, unspecified: Secondary | ICD-10-CM

## 2015-09-22 MED ORDER — BENZONATATE 100 MG PO CAPS: ORAL_CAPSULE | ORAL | Status: DC

## 2015-09-22 MED ORDER — TEMAZEPAM 15 MG PO CAPS: 15.0000 mg | ORAL_CAPSULE | Freq: Every evening | ORAL | Status: DC | PRN

## 2015-09-22 MED ORDER — IPRATROPIUM BROMIDE 0.03 % NA SOLN: 2.0000 | Freq: Two times a day (BID) | NASAL | Status: DC

## 2015-09-22 MED ORDER — AZELASTINE HCL 0.1 % NA SOLN: 2.0000 | Freq: Two times a day (BID) | NASAL | Status: DC

## 2015-09-22 NOTE — Progress Notes (Signed)
Patient ID: Billy Shinall., male    DOB: 1961-04-22  Age: 54 y.o. MRN: [id number]  Chief Complaint  Patient presents with  . Headache    x 2 days  . Sinusitis    runny nose x 1 week - getting worse  . Cough    dry -   . Medication Refill    pt needs refill of Restoril    Subjective:   Patient has a long history of allergic rhinitis in the spring and fall. He is taking Allegra-D on a regular basis. His still is congested despite that. He is now developed a cough, nonproductive and dry. He has not been running fever. He says he has sinus headaches on a regular basis. He attributes the mole to sinuses. Smoke. He continues to have problems sleeping. They sleep with a large dog in bed who interferes with her sleep but they were not willing to make the dog leave the bed. I also have a couple of teenagers to keep him awake later the glycol from. However they try to go to bed early. He says about 3 nights a week he has to use the temazepam, often takes 2 pills. I told him that he should try to limit it to just one. Otherwise no major complaints. He sometimes takes Tylenol ibuprofen for headaches but he says it doesn't do any good. He has been using Afrin spray also not talk to him about getting away from that.  Current allergies, medications, problem list, past/family and social histories reviewed.  Objective:  BP 140/88 mmHg  Pulse 76  Temp(Src) 98.6 F (37 C) (Oral)  Wt 233 lb (105.688 kg)  SpO2 98%  No major acute distress. TMs normal. Nose looks open and not particularly inflamed. Throat clear. Neck supple without nodes. Chest clear.  Assessment & Plan:   Assessment: 1. Insomnia   2. Other seasonal allergic rhinitis   3. Cough       Plan: Allergic rhinitis with secondary viral URI Chronic headaches, possibly chronic tension headaches, discussed with him.    Meds ordered this encounter  Medications  . temazepam (RESTORIL) 15 MG capsule    Sig: Take 1 capsule (15 mg total) by  mouth at bedtime as needed for sleep.    Dispense:  30 capsule    Refill:  2  . azelastine (ASTELIN) 0.1 % nasal spray    Sig: Place 2 sprays into both nostrils 2 (two) times daily. Use in each nostril as directed    Dispense:  30 mL    Refill:  12  . ipratropium (ATROVENT) 0.03 % nasal spray    Sig: Place 2 sprays into both nostrils 2 (two) times daily.    Dispense:  30 mL    Refill:  0  . benzonatate (TESSALON PERLES) 100 MG capsule    Sig: Take 1 or 2 pills 3 times daily as needed for cough    Dispense:  30 capsule    Refill:  0     Consider additional Flonase or Singulair. He is to contact me if things turn purulent.    Patient Instructions  Continue using the Allegra-D on an as-needed basis   Astelin nose spray 1-2 sprays daily each nostril  Ipratropium nose spray 1-2 sprays each nostril 4 times daily only when needed for being very congested  Avoid over-the-counter Afrin  Take the benzonatate cough pills one or 2 pills 3 times daily as needed for cough. Will not cause sedation.  No antibiotics are indicated at this time. Let us know if he started developing a purulent yellow-green discharge.  Drink plenty of fluids always  Consider seeing an allergist if symptoms do not seem to improve a lot.   Avoid nightly use of the Restoril (temazepam). Try to just use 1 and only double up if absolutely necessary      Return if symptoms worsen or fail to improve.   Tawnee Clegg, MD 09/22/2015

## 2015-09-22 NOTE — Patient Instructions (Signed)
Continue using the Allegra-D on an as-needed basis   Astelin nose spray 1-2 sprays daily each nostril  Ipratropium nose spray 1-2 sprays each nostril 4 times daily only when needed for being very congested  Avoid over-the-counter Afrin  Take the benzonatate cough pills one or 2 pills 3 times daily as needed for cough. Will not cause sedation.   No antibiotics are indicated at this time. Let us know if he started developing a purulent yellow-green discharge.  Drink plenty of fluids always  Consider seeing an allergist if symptoms do not seem to improve a lot.   Avoid nightly use of the Restoril (temazepam). Try to just use 1 and only double up if absolutely necessary

## 2015-09-24 ENCOUNTER — Telehealth: Payer: Self-pay

## 2015-09-24 DIAGNOSIS — J019 Acute sinusitis, unspecified: Secondary | ICD-10-CM

## 2015-09-24 NOTE — Telephone Encounter (Signed)
Pt was seen recently for a sinus infection, but didi not get an antibodic, which he believes he needs now -symtoms are worse, nasal drainage now has color   Best number [phone]

## 2015-09-26 NOTE — Telephone Encounter (Signed)
This says that he did get azirthromycin.  It states this was given on 08/29/2015.  Please ask if he took the antibiotic.  Please do this before sending this through patient call.  i am forwarding this to the clinical refill pool.

## 2015-09-27 MED ORDER — AZITHROMYCIN 250 MG PO TABS: ORAL_TABLET | ORAL | Status: DC

## 2015-09-27 NOTE — Telephone Encounter (Signed)
Called th pharmacy and they state the Rx never came to their pharmacy. He never received it.

## 2015-09-27 NOTE — Telephone Encounter (Signed)
Sent to [facility] on [address].  Please advise patient that this was sent in.  We apologize for the mishap with the order (though it was sent through Epic).  Please let us know if there is any more difficulty with picking up prescription.  He will take 2 first day, followed by 1 tablet for the next 4 days.

## 2015-09-28 NOTE — Telephone Encounter (Signed)
Left message Rx was sent in

## 2015-12-18 ENCOUNTER — Ambulatory Visit (INDEPENDENT_AMBULATORY_CARE_PROVIDER_SITE_OTHER): Payer: Commercial Managed Care - PPO | Admitting: Family Medicine

## 2015-12-18 VITALS — BP 132/88 | HR 77 | Temp 98.7°F | Resp 18 | Ht 72.0 in | Wt 229.0 lb

## 2015-12-18 DIAGNOSIS — R509 Fever, unspecified: Secondary | ICD-10-CM

## 2015-12-18 DIAGNOSIS — J111 Influenza due to unidentified influenza virus with other respiratory manifestations: Secondary | ICD-10-CM

## 2015-12-18 DIAGNOSIS — R05 Cough: Secondary | ICD-10-CM

## 2015-12-18 DIAGNOSIS — R059 Cough, unspecified: Secondary | ICD-10-CM

## 2015-12-18 LAB — POCT INFLUENZA A/B
Influenza A, POC: POSITIVE — AB
Influenza B, POC: NEGATIVE

## 2015-12-18 MED ORDER — ALBUTEROL SULFATE (2.5 MG/3ML) 0.083% IN NEBU
2.5000 mg | INHALATION_SOLUTION | Freq: Once | RESPIRATORY_TRACT | Status: AC
  Administered 2015-12-18: 2.5 mg via RESPIRATORY_TRACT

## 2015-12-18 MED ORDER — GUAIFENESIN ER 1200 MG PO TB12: 1.0000 | ORAL_TABLET | Freq: Two times a day (BID) | ORAL | Status: DC | PRN

## 2015-12-18 MED ORDER — OSELTAMIVIR PHOSPHATE 75 MG PO CAPS: 75.0000 mg | ORAL_CAPSULE | Freq: Two times a day (BID) | ORAL | Status: DC

## 2015-12-18 MED ORDER — HYDROCOD POLST-CPM POLST ER 10-8 MG/5ML PO SUER: 5.0000 mL | Freq: Two times a day (BID) | ORAL | Status: DC | PRN

## 2015-12-18 NOTE — Progress Notes (Addendum)
Subjective:    Patient ID: Billy Keller., male    DOB: [birth year], 55 y.o.   MRN: [id number] By signing my name below, I, Billy Keller, attest that this documentation has been prepared under the direction and in the presence of Billy Cheadle, MD. Electronically Signed: Judithe Keller, ER Scribe. 12/18/2015. 8:38 AM.  Chief Complaint  Patient presents with   Nasal Congestion    x 3 days   Cough    HPI HPI Comments: Billy Keller. is a 55 y.o. male who presents to [facility] complaining of upper and lower respiratory congestion and cough since Thursday with associated fever (100) and myalgias. He has associated SOB and chest tightness. He has bee using Theraflu, nyquil and claritin at home. He did not have a flu shot this year. He has no past hx of asthma.   Past Medical History  Diagnosis Date   Allergy    Ischemic colitis ([place])    No Known Allergies  Current Outpatient Prescriptions on File Prior to Visit  Medication Sig Dispense Refill   azelastine (ASTELIN) 0.1 % nasal spray Place 2 sprays into both nostrils 2 (two) times daily. Use in each nostril as directed 30 mL 12   ipratropium (ATROVENT) 0.03 % nasal spray Place 2 sprays into both nostrils 2 (two) times daily. 30 mL 0   temazepam (RESTORIL) 15 MG capsule Take 1 capsule (15 mg total) by mouth at bedtime as needed for sleep. (Patient not taking: Reported on 12/18/2015) 30 capsule 2   No current facility-administered medications on file prior to visit.    Review of Systems  Constitutional: Positive for fever. Negative for chills.  HENT: Positive for congestion, rhinorrhea and sinus pressure.   Respiratory: Positive for cough, chest tightness and shortness of breath.   Cardiovascular: Negative for chest pain.       Objective:  BP 132/88 mmHg   Pulse 77   Temp(Src) 98.7 F (37.1 C)   Resp 18   Ht 6' (1.829 m)   Wt 229 lb (103.874 kg)   BMI 31.05 kg/m2   SpO2 97%  Physical Exam  Constitutional: He is oriented to  person, place, and time. He appears well-developed and well-nourished. No distress.  HENT:  Head: Normocephalic and atraumatic.  TMs and nares normal. Throat with erythema and edema.   Eyes: Pupils are equal, round, and reactive to light.  Neck: Neck supple.  Submandibular and anterior adenopathy, right worse than left.   Cardiovascular: Normal rate, regular rhythm and normal heart sounds.   No murmur heard. Pulmonary/Chest: Effort normal. No respiratory distress.  Bibasilar rales, good air movement.   Musculoskeletal: Normal range of motion.  Neurological: He is alert and oriented to person, place, and time. Coordination normal.  Skin: Skin is warm and dry. He is not diaphoretic.  Psychiatric: He has a normal mood and affect. His behavior is normal.  Nursing note and vitals reviewed.  Albuterol neb in office with resolution of bibasilar rales.    Results for orders placed or performed in visit on 12/18/15  POCT Influenza A/B  Result Value Ref Range   Influenza A, POC Positive (A) Negative   Influenza B, POC Negative Negative    Assessment & Plan:   1. Fever, unspecified   2. Cough   3. Influenza with respiratory manifestation   He did not feel any benefit after albuterol neb despite reduction in bibasilar rales and declines inhaler. Rec to pt that he start pulmonary toileting  due to bibasilar rales - he refuses due to severity of pharyngitis. Understands poss complications of secondary illness and to RTC if not improving in the next 2-3d.    Orders Placed This Encounter  Procedures   POCT Influenza A/B    Meds ordered this encounter  Medications   albuterol (PROVENTIL) (2.5 MG/3ML) 0.083% nebulizer solution 2.5 mg    Sig:    oseltamivir (TAMIFLU) 75 MG capsule    Sig: Take 1 capsule (75 mg total) by mouth 2 (two) times daily.    Dispense:  10 capsule    Refill:  0   chlorpheniramine-HYDROcodone (TUSSIONEX PENNKINETIC ER) 10-8 MG/5ML SUER    Sig: Take 5 mLs by  mouth every 12 (twelve) hours as needed.    Dispense:  120 mL    Refill:  0   Guaifenesin (MUCINEX MAXIMUM STRENGTH) 1200 MG TB12    Sig: Take 1 tablet (1,200 mg total) by mouth every 12 (twelve) hours as needed.    Dispense:  14 tablet    Refill:  1    I personally performed the services described in this documentation, which was scribed in my presence. The recorded information has been reviewed and considered, and addended by me as needed.  Billy Cheadle, MD MPH

## 2015-12-18 NOTE — Patient Instructions (Signed)

## 2016-03-22 ENCOUNTER — Other Ambulatory Visit: Payer: Self-pay | Admitting: Family Medicine

## 2016-06-01 IMAGING — CR DG SHOULDER 2+V*R*
3 series · 3 of 3 positions shown · non-contrast
Comparison: None.

CLINICAL DATA: Shoulder pain after being pulled by dogs

EXAM:
RIGHT SHOULDER - 2+ VIEW

[AP (1 of 2)]
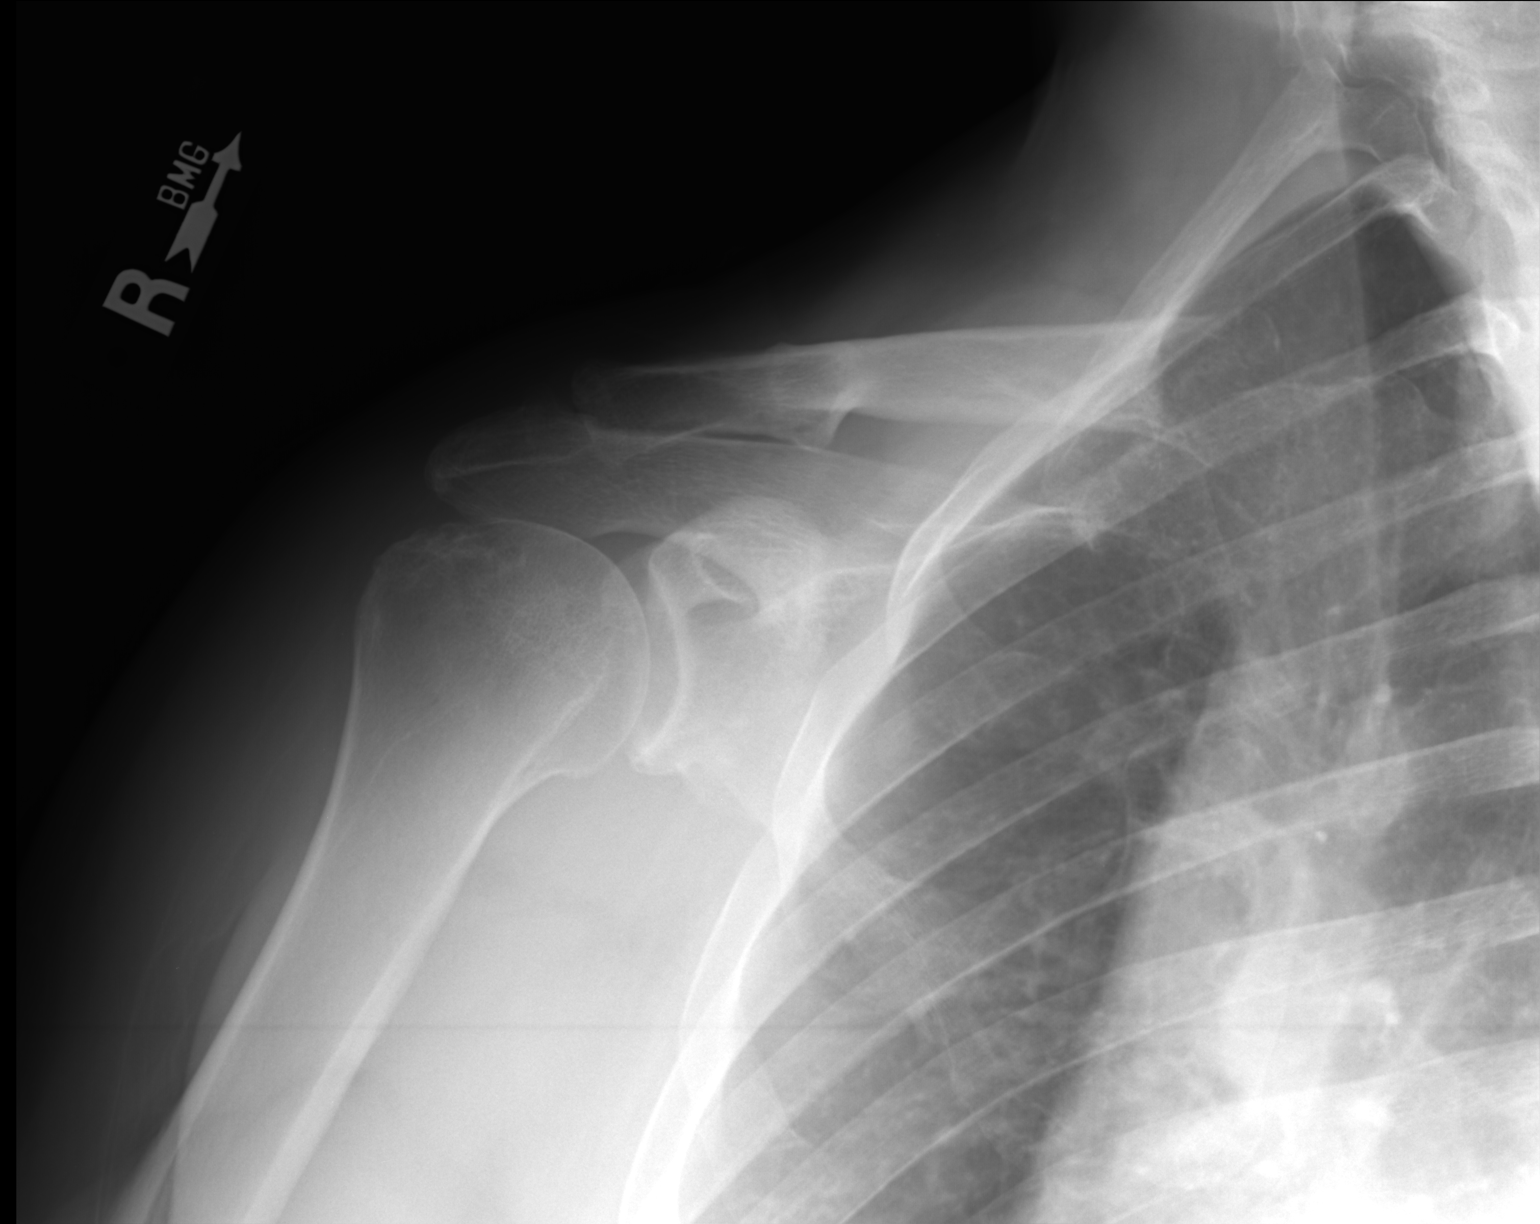

[AP (2 of 2)]
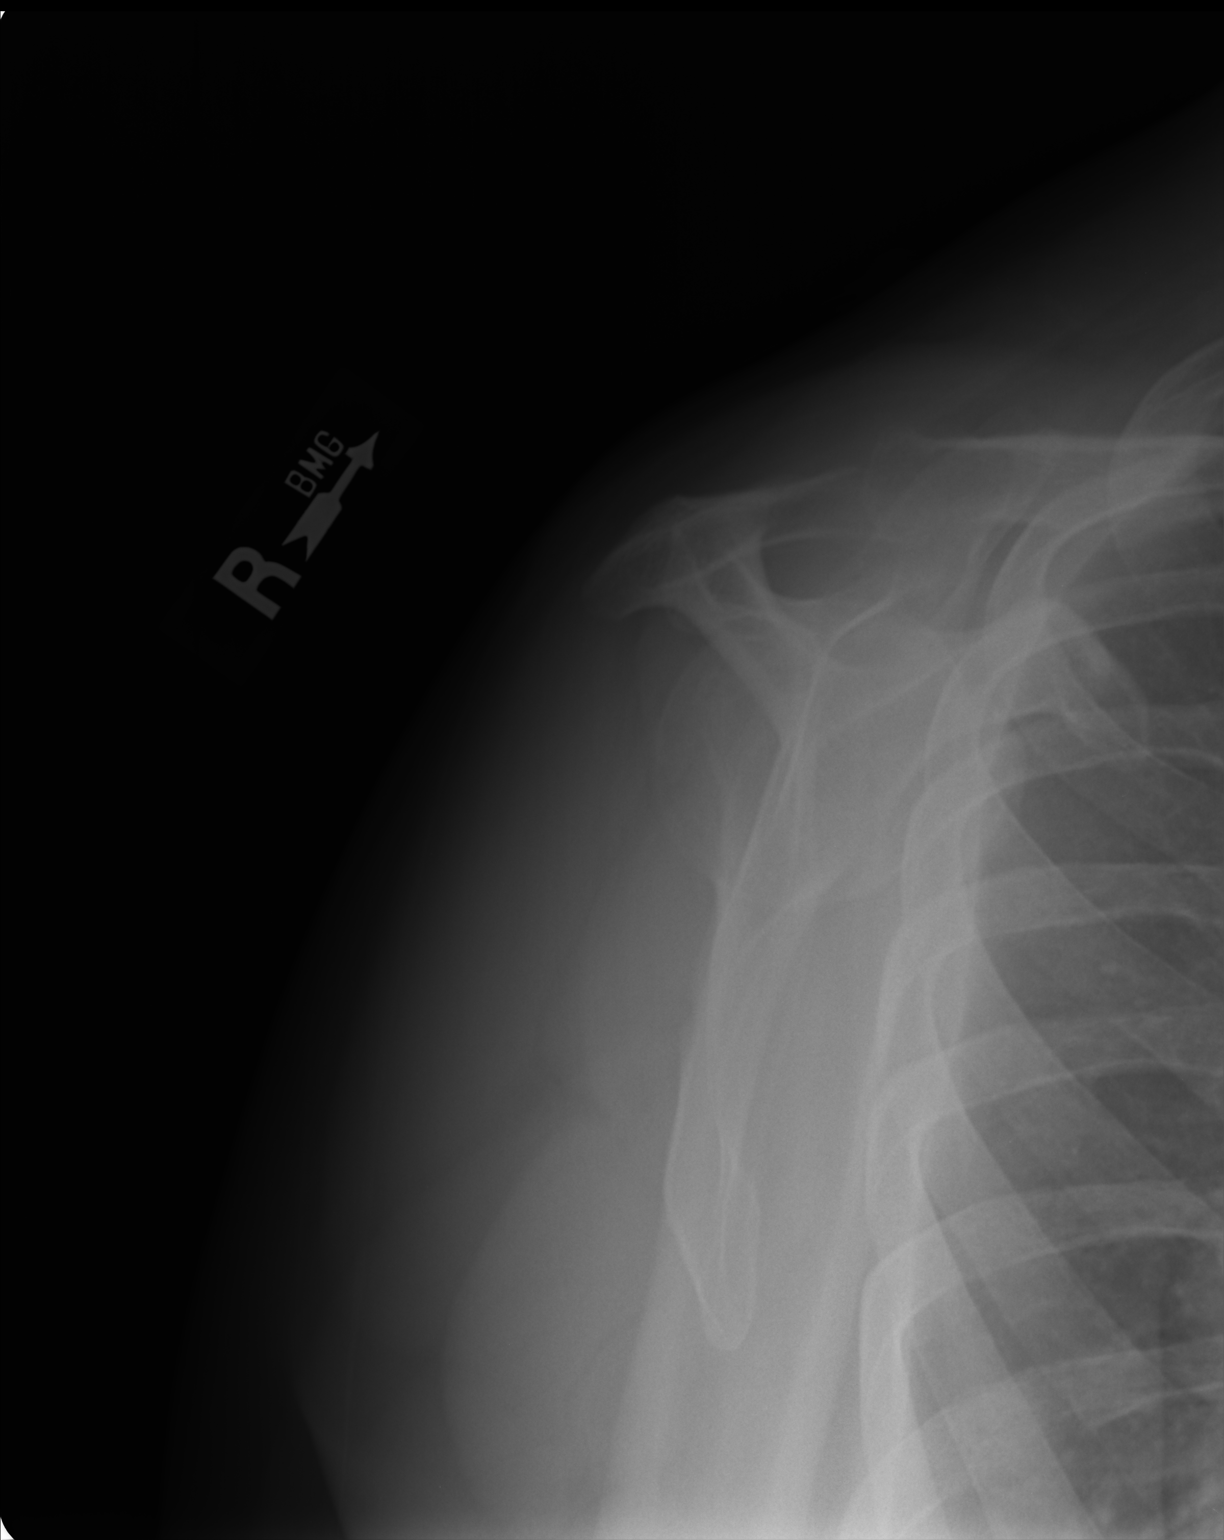

[axial]
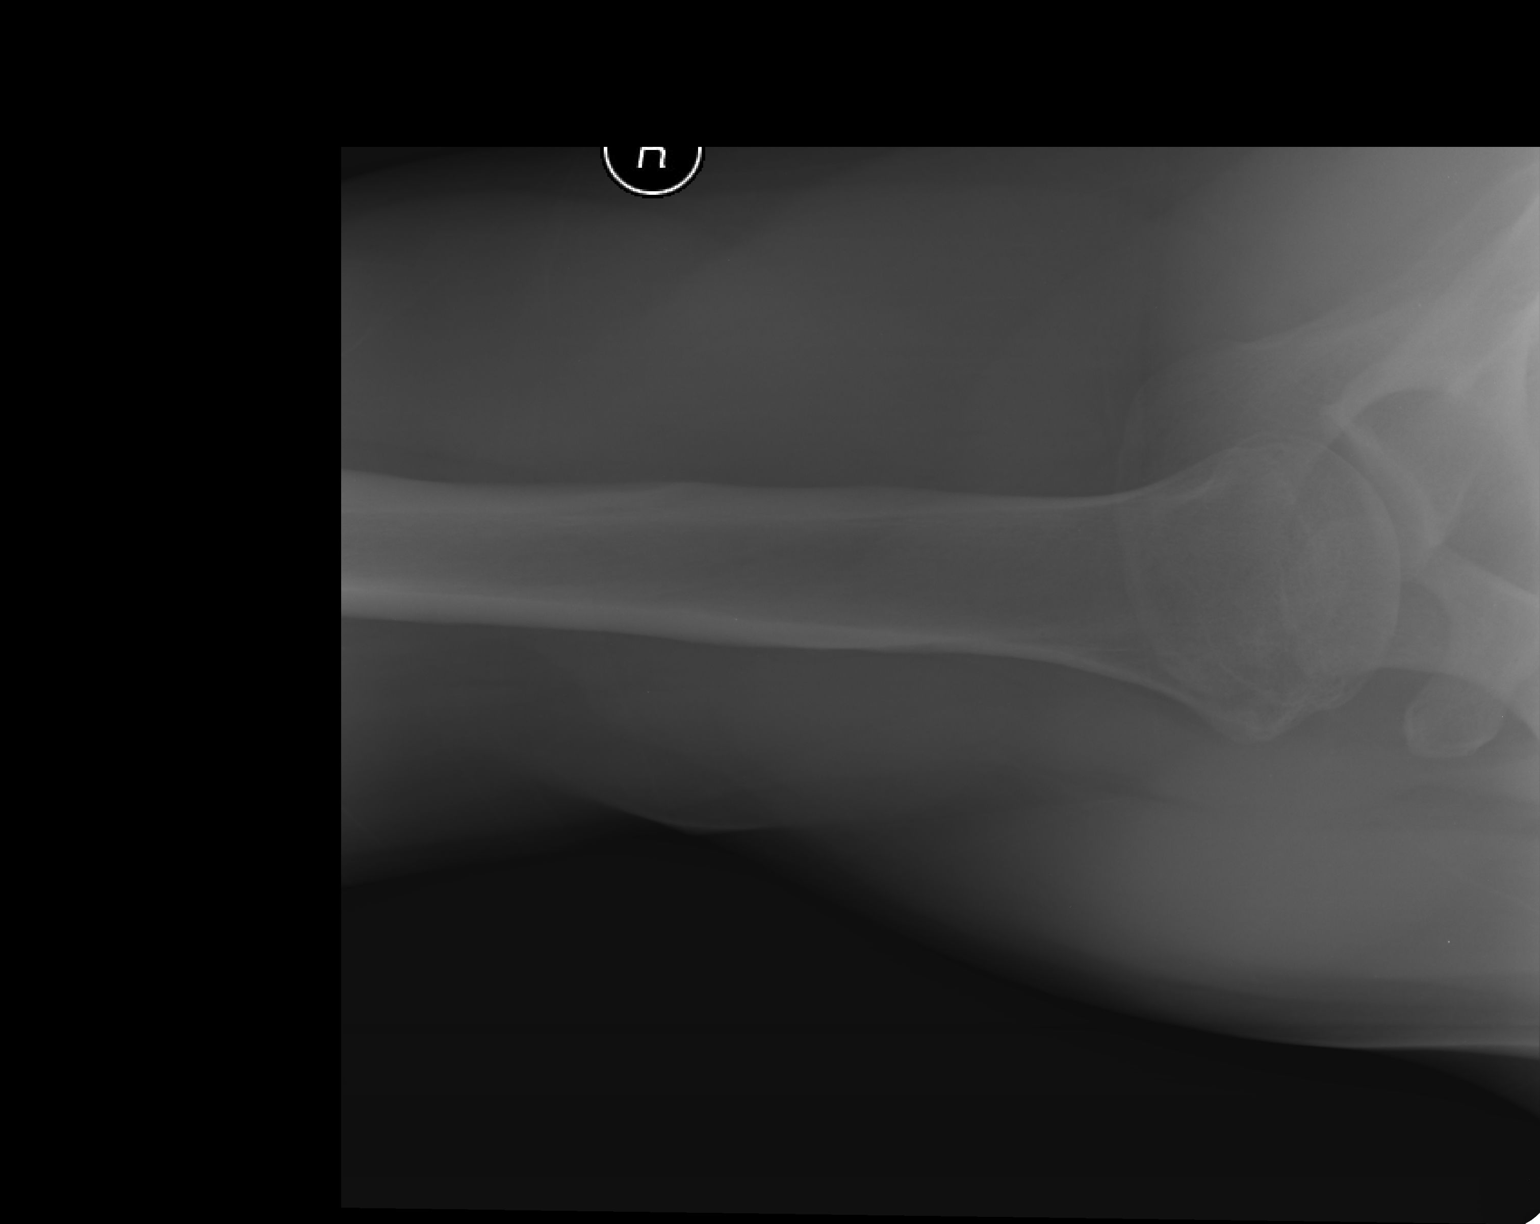

[3 of 3 positions shown; findings below may reference images not displayed]

FINDINGS: There is no evidence of fracture or dislocation. There is no
evidence of arthropathy or other focal bone abnormality. Soft
tissues are unremarkable.
IMPRESSION: No acute osseous injury of the right shoulder.

## 2016-10-16 DIAGNOSIS — J0100 Acute maxillary sinusitis, unspecified: Secondary | ICD-10-CM | POA: Diagnosis not present

## 2016-12-06 DIAGNOSIS — M542 Cervicalgia: Secondary | ICD-10-CM | POA: Diagnosis not present

## 2017-01-24 DIAGNOSIS — E785 Hyperlipidemia, unspecified: Secondary | ICD-10-CM | POA: Diagnosis not present

## 2017-01-24 DIAGNOSIS — R5382 Chronic fatigue, unspecified: Secondary | ICD-10-CM | POA: Diagnosis not present

## 2017-01-24 DIAGNOSIS — J301 Allergic rhinitis due to pollen: Secondary | ICD-10-CM | POA: Diagnosis not present

## 2017-01-24 DIAGNOSIS — F5101 Primary insomnia: Secondary | ICD-10-CM | POA: Diagnosis not present

## 2017-05-27 DIAGNOSIS — L814 Other melanin hyperpigmentation: Secondary | ICD-10-CM | POA: Diagnosis not present

## 2017-05-27 DIAGNOSIS — C4441 Basal cell carcinoma of skin of scalp and neck: Secondary | ICD-10-CM | POA: Diagnosis not present

## 2017-05-27 DIAGNOSIS — D1801 Hemangioma of skin and subcutaneous tissue: Secondary | ICD-10-CM | POA: Diagnosis not present

## 2017-05-27 DIAGNOSIS — C44619 Basal cell carcinoma of skin of left upper limb, including shoulder: Secondary | ICD-10-CM | POA: Diagnosis not present

## 2017-05-27 DIAGNOSIS — C44519 Basal cell carcinoma of skin of other part of trunk: Secondary | ICD-10-CM | POA: Diagnosis not present

## 2017-05-27 DIAGNOSIS — D225 Melanocytic nevi of trunk: Secondary | ICD-10-CM | POA: Diagnosis not present

## 2017-06-26 DIAGNOSIS — C44519 Basal cell carcinoma of skin of other part of trunk: Secondary | ICD-10-CM | POA: Diagnosis not present

## 2017-07-03 DIAGNOSIS — Z85828 Personal history of other malignant neoplasm of skin: Secondary | ICD-10-CM | POA: Diagnosis not present

## 2017-07-03 DIAGNOSIS — C44319 Basal cell carcinoma of skin of other parts of face: Secondary | ICD-10-CM | POA: Diagnosis not present

## 2017-07-24 DIAGNOSIS — C44519 Basal cell carcinoma of skin of other part of trunk: Secondary | ICD-10-CM | POA: Diagnosis not present

## 2017-07-24 DIAGNOSIS — C44619 Basal cell carcinoma of skin of left upper limb, including shoulder: Secondary | ICD-10-CM | POA: Diagnosis not present

## 2017-07-27 ENCOUNTER — Encounter: Payer: Self-pay | Admitting: *Deleted

## 2017-09-24 DIAGNOSIS — L821 Other seborrheic keratosis: Secondary | ICD-10-CM | POA: Diagnosis not present

## 2017-09-24 DIAGNOSIS — Z85828 Personal history of other malignant neoplasm of skin: Secondary | ICD-10-CM | POA: Diagnosis not present

## 2017-09-24 DIAGNOSIS — C44619 Basal cell carcinoma of skin of left upper limb, including shoulder: Secondary | ICD-10-CM | POA: Diagnosis not present

## 2017-09-24 DIAGNOSIS — C44519 Basal cell carcinoma of skin of other part of trunk: Secondary | ICD-10-CM | POA: Diagnosis not present

## 2017-09-24 DIAGNOSIS — L814 Other melanin hyperpigmentation: Secondary | ICD-10-CM | POA: Diagnosis not present

## 2017-11-11 DIAGNOSIS — J019 Acute sinusitis, unspecified: Secondary | ICD-10-CM | POA: Diagnosis not present

## 2018-01-21 DIAGNOSIS — L430 Hypertrophic lichen planus: Secondary | ICD-10-CM | POA: Diagnosis not present

## 2018-01-21 DIAGNOSIS — L821 Other seborrheic keratosis: Secondary | ICD-10-CM | POA: Diagnosis not present

## 2018-01-21 DIAGNOSIS — C4441 Basal cell carcinoma of skin of scalp and neck: Secondary | ICD-10-CM | POA: Diagnosis not present

## 2018-01-21 DIAGNOSIS — D485 Neoplasm of uncertain behavior of skin: Secondary | ICD-10-CM | POA: Diagnosis not present

## 2018-01-21 DIAGNOSIS — L570 Actinic keratosis: Secondary | ICD-10-CM | POA: Diagnosis not present

## 2018-01-21 DIAGNOSIS — D225 Melanocytic nevi of trunk: Secondary | ICD-10-CM | POA: Diagnosis not present

## 2018-01-21 DIAGNOSIS — Z85828 Personal history of other malignant neoplasm of skin: Secondary | ICD-10-CM | POA: Diagnosis not present

## 2018-01-21 DIAGNOSIS — C44619 Basal cell carcinoma of skin of left upper limb, including shoulder: Secondary | ICD-10-CM | POA: Diagnosis not present

## 2018-06-05 ENCOUNTER — Other Ambulatory Visit: Payer: Self-pay

## 2018-06-05 ENCOUNTER — Ambulatory Visit: Payer: 59 | Admitting: Physician Assistant

## 2018-06-05 ENCOUNTER — Encounter: Payer: Self-pay | Admitting: Physician Assistant

## 2018-06-05 VITALS — BP 143/92 | HR 77 | Temp 97.8°F | Resp 16 | Ht 71.0 in | Wt 231.0 lb

## 2018-06-05 DIAGNOSIS — R42 Dizziness and giddiness: Secondary | ICD-10-CM

## 2018-06-05 DIAGNOSIS — J329 Chronic sinusitis, unspecified: Secondary | ICD-10-CM

## 2018-06-05 MED ORDER — AMOXICILLIN-POT CLAVULANATE 875-125 MG PO TABS: 1.0000 | ORAL_TABLET | Freq: Two times a day (BID) | ORAL | 0 refills | Status: AC

## 2018-06-05 MED ORDER — PREDNISONE 20 MG PO TABS: ORAL_TABLET | ORAL | 0 refills | Status: DC

## 2018-06-05 NOTE — Patient Instructions (Addendum)
I am treating you today for viral sinusitis. Start taking Prednisone: 40mg  x 5 days; 20mg  x 5 days If you are not experiencing improvement in 3-4 days start taking Augmenting twice daily for 7 days.   Consider using a Neti Pot (you can buy this at your pharmacy). If not, try the saline sinus rinse.   Take Mucinex D for the next 3-4 days. Drink plenty of water while taking this medication. Water will help thin out your mucus.  Flonase nasal spray - use this twice daily.   Come back if you are not better in 7-10 days.    ACUTE VIRAL RHINOSINUSITIS - Patients with acute viral rhinosinusitis (AVRS) should be managed with supportive care. There are no treatments to shorten the clinical course of the disease.  Natural history - AVRS may not completely resolve within 10 days but is expected to improve. Patients who fail to improve after ?10 days of symptomatic management are more likely to have acute bacterial rhinosinusitis. Symptomatic therapies - Symptomatic management of acute rhinosinusitis (ARS) aims to relieve symptoms of nasal obstruction and runny nose as well as the systemic signs and symptoms such as fever and fatigue. When needed, we suggest over-the-counter (OTC) analgesics and antipyretics, saline irrigation, and intranasal glucocorticoids for symptomatic management in patients with ARS. Analgesics and antipyretics - OTC analgesics and antipyretics such as nonsteroidal anti-inflammatory drugs and acetaminophen can be used for pain and fever relief as needed. Saline irrigation - Mechanical irrigation with saline may reduce the need for pain medication and improve overall patient comfort, particularly in patients with frequent sinus infections. It is important that irrigants be prepared from sterile or bottled water. (See below for instructions)  Intranasal glucocorticoids -  Intranasal glucocorticoids are likely to be most beneficial for patients with underlying allergies. This allows improved  sinus drainage. A higher dose of intranasal glucocorticoids had a stronger effect on symptom improvement. -Other- ?Oral decongestants - Oral decongestants may be useful when eustachian tube dysfunction is a factor for patients with AVRS. These patients may benefit from a short course (three to five days) of oral decongestants. Oral decongestants should be used with caution in patients with cardiovascular disease, hypertension, angle-closure glaucoma, or bladder neck obstruction. ?Intranasal decongestants - Intranasal decongestants are often used as symptomatic therapies by patients. These agents, such as oxymetazoline, may provide a subjective sense of improved nasal patency. There is also concern that intranasal decongestants themselves may provoke mucosal inflammation. If used, topical decongestants should be used sparingly for no more than three consecutive days to avoid rebound congestion, addiction, and mucosal damage associated with long-term use. ?Antihistamines - Antihistamines are frequently used for symptom relief due to their drying effects; however, there are no studies investigating their efficacy for ARS. Over-drying of the mucosa may lead to further discomfort. Additionally, antihistamines are often associated with adverse effects.  ?Mucolytics - Mucolytics such as guaifenesin serve to thin secretions and may promote ease of mucus drainage and clearance.   SALINE NASAL IRRIGATION  The benefits  1. Saline (saltwater) washes the mucus and irritants from your nose.  2. The sinus passages are moisturized.  3. Studies have also shown that a nasal irrigation improves cell function (the cells that move the mucus work better).  The recipe  Use a one-quart glass jar that is thoroughly cleansed.  You may use a large medical syringe (30 cc), water pick with an irrigation tip (preferred method), squeeze bottle, or Neti pot. Do not use a baby bulb syringe. The  syringe or pick should be sterilized  frequently or replaced every two to three weeks to avoid contamination and infection.  Fill with water that has been distilled, previously boiled, or otherwise sterilized. Plain tap water is not recommended, because it is not necessarily sterile.  Add 1 to 1 heaping teaspoons of pickling/canning salt. Do not use table salt, because it contains a large number of additives.  Add 1 teaspoon of baking soda (pure bicarbonate).  Mix ingredients together, and store at room temperature. Discard after one week.  You may also make up a solution from premixed packets that are commercially prepared specifically for nasal irrigation.  The instructions  Irrigate your nose with saline one to two times per day.   If you have been told to use nasal medication, you should always use your saline solution first. The nasal medication is much more effective when sprayed onto clean nasal membranes, and the spray will reach deeper into the nose.   Pour the amount of fluid you plan to use into a clean bowl. Do not put your used syringe back into the storage container, because it contaminates your solution.   You may warm the solution slightly in the microwave, but be sure that the solution is not hot.   Bend over the sink (some people do this in the shower), and squirt the solution into each side of your nose, aiming the stream toward the back of your head, not the top of your head. The solution should flow into one nostril and out of the other, but it will not harm you if you swallow a little.   Some people experience a little burning sensation the first few times that they use buffered saline solution, but this usually goes away after they adapt to it.     IF you received an x-ray today, you will receive an invoice from [facility]. Please contact [facility] at [phone] with questions or concerns regarding your invoice.   IF you received labwork today, you will receive an invoice from  [place]. Please contact [facility] at [phone] with questions or concerns regarding your invoice.   Our billing staff will not be able to assist you with questions regarding bills from these companies.  You will be contacted with the lab results as soon as they are available. The fastest way to get your results is to activate your My Chart account. Instructions are located on the last page of this paperwork. If you have not heard from [place] regarding the results in 2 weeks, please contact this office.

## 2018-06-05 NOTE — Progress Notes (Signed)
   Billy Keller.  MRN: [id number] DOB: 1961-07-03  PCP: Robyne Peers, MD  Subjective:  Pt is a 57 year old male who presents to clinic for dizziness and fatigue. Endorses congestion and HA. He has had troubles with sinusitis in the past with similar symptoms.  Denies chest pain, syncope, palpitations, n/v. He has not taken anything to feel better.   Review of Systems  Constitutional: Negative for chills, diaphoresis, fatigue and fever.  HENT: Negative for congestion, postnasal drip, rhinorrhea, sinus pressure, sinus pain, sneezing and sore throat.   Respiratory: Negative for cough, shortness of breath and wheezing.   Neurological: Positive for dizziness.    There are no active problems to display for this patient.   Current Outpatient Medications on File Prior to Visit  Medication Sig Dispense Refill  . Pseudoephedrine-guaiFENesin (MUCINEX D PO) Take by mouth.    Marland Kitchen azelastine (ASTELIN) 0.1 % nasal spray Place 2 sprays into both nostrils 2 (two) times daily. Use in each nostril as directed (Patient not taking: Reported on 06/05/2018) 30 mL 12  . ipratropium (ATROVENT) 0.03 % nasal spray Place 2 sprays into both nostrils 2 (two) times daily. (Patient not taking: Reported on 06/05/2018) 30 mL 0  . temazepam (RESTORIL) 15 MG capsule Take 1 capsule (15 mg total) by mouth at bedtime as needed for sleep. (Patient not taking: Reported on 12/18/2015) 30 capsule 2   No current facility-administered medications on file prior to visit.     No Known Allergies   Objective:  BP (!) 143/92 (BP Location: Right Arm, Patient Position: Sitting, Cuff Size: Large)   Pulse 77   Temp 97.8 F (36.6 C) (Oral)   Resp 16   Ht 5\' 11"  (1.803 m)   Wt 231 lb (104.8 kg)   SpO2 100%   BMI 32.22 kg/m   Physical Exam  Constitutional: He is oriented to person, place, and time. No distress.  HENT:  Right Ear: Tympanic membrane normal.  Left Ear: Tympanic membrane normal.  Nose: Mucosal edema present.  No rhinorrhea. Right sinus exhibits no maxillary sinus tenderness and no frontal sinus tenderness. Left sinus exhibits no maxillary sinus tenderness and no frontal sinus tenderness.  Mouth/Throat: Oropharynx is clear and moist and mucous membranes are normal.  Cardiovascular: Normal rate, regular rhythm and normal heart sounds.  Pulmonary/Chest: Effort normal and breath sounds normal. No respiratory distress. He has no wheezes. He has no rales.  Neurological: He is alert and oriented to person, place, and time.  Skin: Skin is warm and dry.  Psychiatric: Judgment normal.  Vitals reviewed.   Assessment and Plan :  1. Sinusitis, unspecified chronicity, unspecified location 2. Dizziness - predniSONE (DELTASONE) 20 MG tablet; Take 40mg  x 5 days; then take 20mg  x 5 days  Dispense: 15 tablet; Refill: 0 - amoxicillin-clavulanate (AUGMENTIN) 875-125 MG tablet; Take 1 tablet by mouth 2 (two) times daily for 7 days.  Dispense: 14 tablet; Refill: 0   Mercer Pod, PA-C  [facility] 06/05/2018 10:46 AM  Please note: Portions of this report may have been transcribed using dragon voice recognition software. Every effort was made to ensure accuracy; however, inadvertent computerized transcription errors may be present.

## 2018-06-26 ENCOUNTER — Ambulatory Visit: Payer: 59 | Admitting: Family Medicine

## 2018-06-26 ENCOUNTER — Encounter: Payer: Self-pay | Admitting: Family Medicine

## 2018-06-26 ENCOUNTER — Other Ambulatory Visit: Payer: Self-pay

## 2018-06-26 VITALS — BP 154/104 | HR 72 | Temp 98.5°F | Resp 17 | Ht 71.0 in | Wt 223.8 lb

## 2018-06-26 DIAGNOSIS — J0110 Acute frontal sinusitis, unspecified: Secondary | ICD-10-CM | POA: Diagnosis not present

## 2018-06-26 DIAGNOSIS — R030 Elevated blood-pressure reading, without diagnosis of hypertension: Secondary | ICD-10-CM

## 2018-06-26 DIAGNOSIS — J302 Other seasonal allergic rhinitis: Secondary | ICD-10-CM

## 2018-06-26 DIAGNOSIS — F5101 Primary insomnia: Secondary | ICD-10-CM

## 2018-06-26 MED ORDER — PREDNISONE 20 MG PO TABS: ORAL_TABLET | ORAL | 0 refills | Status: DC

## 2018-06-26 MED ORDER — IPRATROPIUM BROMIDE 0.03 % NA SOLN: 2.0000 | Freq: Two times a day (BID) | NASAL | 0 refills | Status: DC

## 2018-06-26 MED ORDER — TEMAZEPAM 15 MG PO CAPS: 15.0000 mg | ORAL_CAPSULE | Freq: Every evening | ORAL | 0 refills | Status: DC | PRN

## 2018-06-26 NOTE — Patient Instructions (Signed)
° ° ° °  If you have lab work done today you will be contacted with your lab results within the next 2 weeks.  If you have not heard from us then please contact us. The fastest way to get your results is to register for My Chart. ° ° °IF you received an x-ray today, you will receive an invoice from [facility]. Please contact [facility] at [phone] with questions or concerns regarding your invoice.  ° °IF you received labwork today, you will receive an invoice from [facility]. Please contact [facility] at [phone] with questions or concerns regarding your invoice.  ° °Our billing staff will not be able to assist you with questions regarding bills from these companies. ° °You will be contacted with the lab results as soon as they are available. The fastest way to get your results is to activate your My Chart account. Instructions are located on the last page of this paperwork. If you have not heard from us regarding the results in 2 weeks, please contact this office. °  ° ° ° °

## 2018-06-26 NOTE — Progress Notes (Signed)
Chief Complaint  Patient presents with  . allergy flare per pt it happens around this time of the year    head and nasal congestion, ? sinsus infection, drainage this morning that was clear.  Pt c/o feeling off balance and low energy level.    HPI Sinusitis Pt reports that he started having a feeling like "jello in his head" and his "equilibrium is off" He denies fevers or chills He is a nonsmoker He has some shortness of breath No asthma  He is not using the atrovent nasal spray and would like a refill He states that he tried Afrin and felt some relief He is taking Claritin-D has always raises his blood pressure He reports that the feeling in his head is heavy and he has pain behind his eyes   Elevated blood pressure  He reports taking claritin D which always raises his bp No chest pains or palpitation No history of hypertension  BP Readings from Last 3 Encounters:  06/26/18 (!) 154/104  06/05/18 (!) 143/92  12/18/15 132/88    Insomnia Pt woke up feeling groggy He was taking alleve PM for sleep With his sinus medications He does not have any more restoril He has not seen his PCP in a while   Past Medical History:  Diagnosis Date  . Allergy   . Ischemic colitis [facility])     Current Outpatient Medications  Medication Sig Dispense Refill  . ipratropium (ATROVENT) 0.03 % nasal spray Place 2 sprays into both nostrils 2 (two) times daily. 30 mL 0  . predniSONE (DELTASONE) 20 MG tablet Take 40mg  x 5 days; then take 20mg  x 5 days 15 tablet 0  . temazepam (RESTORIL) 15 MG capsule Take 1 capsule (15 mg total) by mouth at bedtime as needed for sleep. 30 capsule 0   No current facility-administered medications for this visit.     Allergies: No Known Allergies  No past surgical history on file.  Social History   Socioeconomic History  . Marital status: Married    Spouse name: Not on file  . Number of children: Not on file  . Years of education: Not on file  . Highest  education level: Not on file  Occupational History  . Not on file  Social Needs  . Financial resource strain: Not on file  . Food insecurity:    Worry: Not on file    Inability: Not on file  . Transportation needs:    Medical: Not on file    Non-medical: Not on file  Tobacco Use  . Smoking status: Never Smoker  . Smokeless tobacco: Current User    Types: Snuff  Substance and Sexual Activity  . Alcohol use: Yes    Comment: 7 drinks per week  . Drug use: No  . Sexual activity: Yes  Lifestyle  . Physical activity:    Days per week: Not on file    Minutes per session: Not on file  . Stress: Not on file  Relationships  . Social connections:    Talks on phone: Not on file    Gets together: Not on file    Attends religious service: Not on file    Active member of club or organization: Not on file    Attends meetings of clubs or organizations: Not on file    Relationship status: Not on file  Other Topics Concern  . Not on file  Social History Narrative  . Not on file    Family History  Problem Relation Age of Onset  . Congestive Heart Failure Father   . Colon cancer Neg Hx   . Stomach cancer Neg Hx      ROS Review of Systems See HPI Constitution: No fevers or chills No malaise No diaphoresis Skin: No rash or itching Eyes: no blurry vision, no double vision GU: no dysuria or hematuria Neuro: no dizziness, +sinus headaches all others reviewed and negative   Objective: Vitals:   06/26/18 0943  BP: (!) 154/104  Pulse: 72  Resp: 17  Temp: 98.5 F (36.9 C)  TempSrc: Oral  SpO2: 97%  Weight: 223 lb 12.8 oz (101.5 kg)  Height: 5\' 11"  (1.803 m)    Physical Exam General: alert, oriented, in NAD Head: normocephalic, atraumatic, + frontal sinus tenderness Eyes: EOM intact, no scleral icterus or conjunctival injection Ears: TM clear bilaterally Nose: mucosa nonerythematous, nonedematous Throat: no pharyngeal exudate or erythema Lymph: no posterior auricular,  submental or cervical lymph adenopathy Heart: normal rate, normal sinus rhythm, no murmurs Lungs: clear to auscultation bilaterally, no wheezing   Assessment and Plan Billy Keller was seen today for allergy flare per pt it happens around this time of the year.  Diagnoses and all orders for this visit:  Elevated BP without diagnosis of hypertension- he does not have blood pressure issues chronically Advised to see his PCP for bp recheck once off the sinus medication  Sinusitis, acute and non-recurrent -     predniSONE (DELTASONE) 20 MG tablet; Take 40mg  x 5 days; then take 20mg  x 5 days -  Will give a taper of prednisone  Other seasonal allergic rhinitis -     ipratropium (ATROVENT) 0.03 % nasal spray; Place 2 sprays into both nostrils 2 (two) times daily. -    Advised atrovent nasal spray to help with congestion  Insomnia- advised to stop the allergy meds with benadryl as the mixture is going to make him too groggy alleve pm with claritin D with otc cough medication is too much -     temazepam (RESTORIL) 15 MG capsule; Take 1 capsule (15 mg total) by mouth at bedtime as needed for sleep.     [place]

## 2018-07-29 DIAGNOSIS — L570 Actinic keratosis: Secondary | ICD-10-CM | POA: Diagnosis not present

## 2018-07-29 DIAGNOSIS — C4441 Basal cell carcinoma of skin of scalp and neck: Secondary | ICD-10-CM | POA: Diagnosis not present

## 2018-09-18 ENCOUNTER — Encounter: Payer: Self-pay | Admitting: Family Medicine

## 2018-09-18 ENCOUNTER — Encounter

## 2018-09-18 ENCOUNTER — Other Ambulatory Visit: Payer: Self-pay

## 2018-09-18 ENCOUNTER — Ambulatory Visit: Payer: 59 | Admitting: Family Medicine

## 2018-09-18 VITALS — BP 138/88 | HR 80 | Temp 98.2°F | Resp 18 | Ht 71.0 in | Wt 231.2 lb

## 2018-09-18 DIAGNOSIS — J302 Other seasonal allergic rhinitis: Secondary | ICD-10-CM

## 2018-09-18 DIAGNOSIS — Z23 Encounter for immunization: Secondary | ICD-10-CM | POA: Diagnosis not present

## 2018-09-18 DIAGNOSIS — J310 Chronic rhinitis: Secondary | ICD-10-CM

## 2018-09-18 DIAGNOSIS — F5101 Primary insomnia: Secondary | ICD-10-CM | POA: Insufficient documentation

## 2018-09-18 DIAGNOSIS — R030 Elevated blood-pressure reading, without diagnosis of hypertension: Secondary | ICD-10-CM

## 2018-09-18 DIAGNOSIS — J329 Chronic sinusitis, unspecified: Secondary | ICD-10-CM

## 2018-09-18 MED ORDER — TEMAZEPAM 15 MG PO CAPS: 15.0000 mg | ORAL_CAPSULE | Freq: Every evening | ORAL | 0 refills | Status: AC | PRN

## 2018-09-18 MED ORDER — IPRATROPIUM BROMIDE 0.03 % NA SOLN: 2.0000 | Freq: Two times a day (BID) | NASAL | 1 refills | Status: DC

## 2018-09-18 NOTE — Assessment & Plan Note (Signed)
Advised allergy testing. Will wait on starting singulair so that he can get an accurate allergy testing

## 2018-09-18 NOTE — Assessment & Plan Note (Signed)
Would like to see bp while he is off the otc decongestants

## 2018-09-18 NOTE — Progress Notes (Signed)
Established Patient Office Visit  Subjective:  Patient ID: Billy Plantz., male    DOB: 02/27/1961  Age: 57 y.o. MRN: [id number]  CC:  Chief Complaint  Patient presents with  . sinus pressure    x2days   . Cough    dry cough  . Headache    HPI Billy Keller. 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 so he gets recurrent sinus congestions He states that he has never had allergy testing His last episode of sinusitis was in September 2019 He states that his symptoms resolved and then he noticed that he started getting congested and his symptoms got progressively worse 2 days ago With headach, sinus pressure, cough, blurry vision and postnasal drip as well as sore throat He denies fevers or chills He has been taking otc meds such as claritin D, nyquil and dayquil  BP Readings from Last 3 Encounters:  09/18/18 (!) 158/104  06/26/18 (!) 154/104  06/05/18 (!) 143/92   Insomnia He uses temazepam as needed for insomnia He reports that he has been taking dayquil and nyquil for cold which helps his sleep He reports that he has been sleeping worse compared to his usual and feels tired. He only has  A few tabs of temazepam left.   Past Medical History:  Diagnosis Date  . Allergy   . Ischemic colitis ([place])     No past surgical history on file.  Family History  Problem Relation Age of Onset  . Congestive Heart Failure Father   . Colon cancer Neg Hx   . Stomach cancer Neg Hx     Social History   Socioeconomic History  . Marital status: Married    Spouse name: Not on file  . Number of children: Not on file  . Years of education: Not on file  . Highest education level: Not on file  Occupational History  . Not on file  Social Needs  . Financial resource strain: Not on file  . Food insecurity:    Worry: Not on file    Inability: Not on file  . Transportation needs:    Medical: Not on file   Non-medical: Not on file  Tobacco Use  . Smoking status: Never Smoker  . Smokeless tobacco: Current User    Types: Snuff  Substance and Sexual Activity  . Alcohol use: Yes    Comment: 7 drinks per week  . Drug use: No  . Sexual activity: Yes  Lifestyle  . Physical activity:    Days per week: Not on file    Minutes per session: Not on file  . Stress: Not on file  Relationships  . Social connections:    Talks on phone: Not on file    Gets together: Not on file    Attends religious service: Not on file    Active member of club or organization: Not on file    Attends meetings of clubs or organizations: Not on file    Relationship status: Not on file  . Intimate partner violence:    Fear of current or ex partner: Not on file    Emotionally abused: Not on file    Physically abused: Not on file    Forced sexual activity: Not on file  Other Topics Concern  . Not on file  Social History Narrative  . Not on file    Outpatient Medications Prior to  Visit  Medication Sig Dispense Refill  . ipratropium (ATROVENT) 0.03 % nasal spray Place 2 sprays into both nostrils 2 (two) times daily. 30 mL 0  . temazepam (RESTORIL) 15 MG capsule Take 1 capsule (15 mg total) by mouth at bedtime as needed for sleep. 30 capsule 0  . predniSONE (DELTASONE) 20 MG tablet Take 40mg  x 5 days; then take 20mg  x 5 days 15 tablet 0   No facility-administered medications prior to visit.     No Known Allergies  ROS Review of Systems See hpi Review of Systems  Constitutional: Negative for activity change, appetite change, chills and fever.  HENT: see hpi Respiratory: see hpi Gastrointestinal: Negative for diarrhea, nausea and vomiting.  Genitourinary: Negative for difficulty urinating, dysuria, flank pain and hematuria.  Musculoskeletal: Negative for back pain, joint swelling and neck pain.  Neurological: Negative for dizziness, speech difficulty, light-headedness and numbness.  See HPI. All other review  of systems negative.     Objective:     BP (!) 158/104   Pulse 80   Temp 98.2 F (36.8 C) (Oral)   Resp 18   Ht 5\' 11"  (1.803 m)   Wt 231 lb 3.2 oz (104.9 kg)   SpO2 95%   BMI 32.25 kg/m  Wt Readings from Last 3 Encounters:  09/18/18 231 lb 3.2 oz (104.9 kg)  06/26/18 223 lb 12.8 oz (101.5 kg)  06/05/18 231 lb (104.8 kg)    Physical Exam  General: alert, oriented, in NAD Head: normocephalic, atraumatic, no sinus tenderness Eyes: EOM intact, no scleral icterus or conjunctival injection Ears: TM clear bilaterally Nose: mucosa nonerythematous, nonedematous Throat: no pharyngeal exudate or erythema Lymph: no posterior auricular, submental or cervical lymph adenopathy Heart: normal rate, normal sinus rhythm, no murmurs Lungs: clear to auscultation bilaterally, no wheezing    Health Maintenance Due  Topic Date Due  . Hepatitis C Screening  [birth year]  . HIV Screening  10/09/1976  . TETANUS/TDAP  10/09/1980    There are no preventive care reminders to display for this patient.  No results found for: TSH Lab Results  Component Value Date   WBC 7.0 04/04/2015   HGB 15.2 04/04/2015   HCT 45.7 04/04/2015   MCV 92.2 04/04/2015   Lab Results  Component Value Date   NA 139 04/04/2015   K 4.1 04/04/2015   CO2 27 04/04/2015   GLUCOSE 115 (H) 04/04/2015   BUN 15 04/04/2015   CREATININE 1.02 04/04/2015   BILITOT 0.4 04/04/2015   ALKPHOS 83 04/04/2015   AST 36 04/04/2015   ALT 54 (H) 04/04/2015   PROT 7.0 04/04/2015   ALBUMIN 4.1 04/04/2015   CALCIUM 9.2 04/04/2015   No results found for: CHOL No results found for: HDL No results found for: LDLCALC No results found for: TRIG No results found for: CHOLHDL No results found for: HGBA1C    Assessment & Plan:   Problem List Items Addressed This Visit      Respiratory   Recurrent sinusitis    Continue atrovent spray twice a day for week to help with congestion. Continue otc medications but avoid  dextromorphan      Relevant Medications   ipratropium (ATROVENT) 0.03 % nasal spray   Chronic rhinitis - Primary    Advised allergy testing. Will wait on starting singulair so that he can get an accurate allergy testing      Relevant Orders   Ambulatory referral to Allergy     Other   Blood pressure  elevated without history of HTN    Would like to see bp while he is off the otc decongestants      Primary insomnia    Refilled Temazepam      Relevant Medications   temazepam (RESTORIL) 15 MG capsule    Other Visit Diagnoses    Other seasonal allergic rhinitis       Relevant Medications   ipratropium (ATROVENT) 0.03 % nasal spray   Need for Tdap vaccination       Relevant Orders   Tdap vaccine greater than or equal to 7yo IM      Meds ordered this encounter  Medications  . DISCONTD: ipratropium (ATROVENT) 0.03 % nasal spray    Sig: Place 2 sprays into both nostrils 2 (two) times daily.    Dispense:  30 mL    Refill:  1  . temazepam (RESTORIL) 15 MG capsule    Sig: Take 1 capsule (15 mg total) by mouth at bedtime as needed for sleep.    Dispense:  30 capsule    Refill:  0  . ipratropium (ATROVENT) 0.03 % nasal spray    Sig: Place 2 sprays into both nostrils 2 (two) times daily.    Dispense:  30 mL    Refill:  1    Follow-up: No follow-ups on file.    Forrest Moron, MD

## 2018-09-18 NOTE — Patient Instructions (Addendum)
If you have lab work done today you will be contacted with your lab results within the next 2 weeks.  If you have not heard from [place] then please contact us. The fastest way to get your results is to register for My Chart.   IF you received an x-ray today, you will receive an invoice from [facility]. Please contact [facility] at [phone] with questions or concerns regarding your invoice.   IF you received labwork today, you will receive an invoice from [facility]. Please contact [facility] at [phone] with questions or concerns regarding your invoice.   Our billing staff will not be able to assist you with questions regarding bills from these companies.  You will be contacted with the lab results as soon as they are available. The fastest way to get your results is to activate your My Chart account. Instructions are located on the last page of this paperwork. If you have not heard from [place] regarding the results in 2 weeks, please contact this office.     Allergy Shots, Adult Allergy shots, also known as allergen immunotherapy, is a treatment that helps reduce allergy symptoms or conditions such as:  Sneezing.  Itchy, watery eyes.  A runny, stuffy nose.  Asthma.  The treatment may benefit people who are allergic to any of these substances:  Pollen from grasses, trees, plants, and weeds.  Insect venom.  Animal dander.  House dust mites.  Molds.  The treatment is not done for food allergies. During your allergy immunotherapy treatments, the substance that you are allergic to (allergen) will be injected under your skin.  At first, only a little bit of the substance will be given.  Over time, the amount will slowly be increased.  The treatments allow your body to build up a tolerance (immunity) to the allergen and create proteins called antibodies that block the effect that the allergen has on your body.  Most people start by getting shots 1-3  times a week for 3-6 months, then they continue to get maintenance shots about one time per month for life. Some people can stop getting shots after 3-5 years. It may be necessary to stop this treatment if:  The shots do not work for you.  You start taking certain medicines that interact with the shots.  You miss many appointments for your shots.  Allergy tablets given under the tongue is another treatment option that may work for certain types of allergies. Tell a health care provider about:  All medicines you are taking, including vitamins, herbs, eye drops, creams, and over-the-counter medicines. If you are taking certain types of heart medicine (beta blockers), you may not be able to receive allergy shots.  Any blood disorders you have.  Any medical conditions you have, especially asthma.  Whether you are pregnant or may be pregnant. What are the risks? This treatment can cause side effects. The most common side effects affect the injection area and include mild redness and swelling. They often go away on their own. Serious reactions, while rare, can happen. A serious reaction that spreads throughout the body (systemic reaction) may require immediate treatment by a health care provider. Reactions usually happen within the first 30 minutes after the shots (injections), but they can occur later. What happens before the procedure?  Let your health care provider know if you are not feeling well the day of the injection. What happens during the procedure?  The allergen will be injected into your skin.  The procedure may vary among health care providers and hospitals. What happens after the procedure?  You will need to stay at the clinic for up to 30 minutes so a health care provider can be sure that you do not have serious side effects.  The shots will begin to work shortly after you begin treatment, but your allergy symptoms may not improve for 3-12 months.  Get help right away if  you have any symptoms of a systemic reaction. These include: ? Itchy, red, swollen areas of skin (hives) or rash. ? Itchy eyes, nose, or throat. ? Runny nose or nasal congestion. ? Coughing or trouble breathing. ? Wheezing. ? Chest tightness. ? Swelling of the throat. ? Nausea. ? Dizziness.  Keep all follow-up visits as told by your health care provider. This is important. Summary  Allergen immunotherapy is a treatment that helps reduce allergy symptoms.  Before receiving injections, tell a health care provider about all medicines you are taking.  The most common side effects of allergy shots are mild redness and swelling at the injection site.  There is a risk of a serious allergic reaction. Seek medical care right away if you develop wheezing, chest tightness, or any trouble with breathing. This information is not intended to replace advice given to you by your health care provider. Make sure you discuss any questions you have with your health care provider. Document Released: 07/03/2008 Document Revised: 08/13/2016 Document Reviewed: 08/13/2016 Elsevier Interactive Patient Education  [employer].

## 2018-09-18 NOTE — Assessment & Plan Note (Signed)
Refilled Temazepam

## 2018-09-18 NOTE — Assessment & Plan Note (Signed)
Continue atrovent spray twice a day for week to help with congestion. Continue otc medications but avoid dextromorphan

## 2018-09-20 DIAGNOSIS — R05 Cough: Secondary | ICD-10-CM | POA: Diagnosis not present

## 2018-09-20 DIAGNOSIS — R51 Headache: Secondary | ICD-10-CM | POA: Diagnosis not present

## 2018-09-20 DIAGNOSIS — J0140 Acute pansinusitis, unspecified: Secondary | ICD-10-CM | POA: Diagnosis not present

## 2018-10-04 DIAGNOSIS — R070 Pain in throat: Secondary | ICD-10-CM | POA: Diagnosis not present

## 2018-10-04 DIAGNOSIS — R42 Dizziness and giddiness: Secondary | ICD-10-CM | POA: Diagnosis not present

## 2018-10-04 DIAGNOSIS — J00 Acute nasopharyngitis [common cold]: Secondary | ICD-10-CM | POA: Diagnosis not present

## 2018-10-04 DIAGNOSIS — J302 Other seasonal allergic rhinitis: Secondary | ICD-10-CM | POA: Diagnosis not present

## 2018-10-29 ENCOUNTER — Encounter: Payer: Self-pay | Admitting: Allergy

## 2018-10-29 ENCOUNTER — Ambulatory Visit: Payer: 59 | Admitting: Allergy

## 2018-10-29 VITALS — BP 136/78 | HR 74 | Temp 97.6°F | Resp 16 | Ht 71.0 in | Wt 228.0 lb

## 2018-10-29 DIAGNOSIS — J302 Other seasonal allergic rhinitis: Secondary | ICD-10-CM | POA: Insufficient documentation

## 2018-10-29 DIAGNOSIS — J3089 Other allergic rhinitis: Secondary | ICD-10-CM | POA: Diagnosis not present

## 2018-10-29 DIAGNOSIS — R05 Cough: Secondary | ICD-10-CM | POA: Diagnosis not present

## 2018-10-29 DIAGNOSIS — R059 Cough, unspecified: Secondary | ICD-10-CM | POA: Insufficient documentation

## 2018-10-29 MED ORDER — IPRATROPIUM BROMIDE 0.03 % NA SOLN: 2.0000 | Freq: Two times a day (BID) | NASAL | 1 refills | Status: AC

## 2018-10-29 MED ORDER — AZELASTINE HCL 0.1 % NA SOLN: NASAL | 5 refills | Status: AC

## 2018-10-29 MED ORDER — FLUTICASONE PROPIONATE 93 MCG/ACT NA EXHU: 2.0000 "application " | INHALANT_SUSPENSION | Freq: Two times a day (BID) | NASAL | 5 refills | Status: AC

## 2018-10-29 MED ORDER — IPRATROPIUM BROMIDE 0.03 % NA SOLN: 2.0000 | Freq: Two times a day (BID) | NASAL | 1 refills | Status: DC

## 2018-10-29 NOTE — Progress Notes (Signed)
New Patient Note  RE: Billy Keller. MRN: [id number] DOB: [birth year] Date of Office Visit: 10/29/2018  Referring provider: Forrest Moron, MD Primary care provider: Forrest Moron, MD  Chief Complaint: rhinitis  History of present illness: Billy Keller. is a 58 y.o. male presenting today for consultation for allergies. He reports that over the last several years he has been experiencing symptoms including nasal congestion, runny nose, thick post nasal drainage, frequent throat clearing, and occular pruritis. He reports that these symptoms occur on a regular basis with seasonal variation in the spring and fall. He reports that his head feels like "a frozen pineapple" with pressure located deep inside. He reports at least 2 sinus infections each year requiring antibiotic and prednisone use for resolution. He is currently taking Claritin D for congestion and azelastine and Atrovent for a runny nose. He denies a history of asthma though he did require an albuterol inhaler several years ago for an upper respiratory infection. He denies atopic dermatitis. He reports that he has had several skin cancers removed and is followed closely by dermatology. He eats a varied diet and is not avoiding any foods, however, he reports that he has recently stopped drinking red wine and his allergy symptoms have seemed to improve. His current medications are listed in the chart.    Review of systems: Review of Systems  Constitutional: Negative.   HENT: Positive for congestion.   Eyes: Negative.   Respiratory: Positive for cough.   Cardiovascular: Negative.   Gastrointestinal: Negative.   Genitourinary: Negative.   Musculoskeletal: Negative.   Skin: Negative.   Neurological: Negative.   Endo/Heme/Allergies: Negative.     All other systems negative unless noted above in HPI  Past medical history: Past Medical History:  Diagnosis Date  . Allergy   . Ischemic colitis [facility])     Past surgical  history: History reviewed. No pertinent surgical history.  Family history:  Family History  Problem Relation Age of Onset  . Congestive Heart Failure Father   . Colon cancer Neg Hx   . Stomach cancer Neg Hx     Social history:  Socioeconomic History  . Marital status: Married  Tobacco Use  . Smoking status: Never Smoker  . Smokeless tobacco: Current User    Types: Snuff  Substance and Sexual Activity  . Alcohol use: Yes    Comment: 7 drinks per week  . Drug use: No  . Sexual activity: Yes    Medication List: Allergies as of 10/29/2018   No Known Allergies     Medication List       Accurate as of October 29, 2018  3:44 PM. Always use your most recent med list.        ASTAXANTHIN PO Take 54 mg by mouth daily.   azelastine 0.1 % nasal spray Commonly known as:  ASTELIN 2 sprays by Each Nare route Two (2) times a day.   CALCIUM 1200 PO Take 1 capsule by mouth daily.   CREATINE PO Take 5.1 g by mouth daily.   ferrous sulfate 325 (65 FE) MG EC tablet Take 325 mg by mouth 3 (three) times daily with meals.   FISH OIL PO Take 1.4 g by mouth 2 (two) times daily.   Fluticasone Propionate 93 MCG/ACT Exhu Commonly known as:  [place] 2 application into the nose 2 (two) times daily.   ipratropium 0.03 % nasal spray Commonly known as:  ATROVENT Place 2 sprays into both nostrils  2 (two) times daily.   loratadine 10 MG tablet Commonly known as:  CLARITIN Take by mouth.   NON FORMULARY Epimedium 200 mg BID   temazepam 15 MG capsule Commonly known as:  RESTORIL Take 1 capsule (15 mg total) by mouth at bedtime as needed for sleep.   TURMERIC PO Take 667 mg by mouth 2 (two) times daily.   UNABLE TO FIND Japanese Fleece Flower 70 mg BID   UNABLE TO FIND Boran  Daily   VITAMIN A PO Take 600 mg by mouth daily.   vitamin C 500 MG tablet Commonly known as:  ASCORBIC ACID Take 500 mg by mouth daily.   Vitamin D (Ergocalciferol) 1.25 MG (50000 UT)  Caps capsule Commonly known as:  DRISDOL Take 50,000 Units by mouth every 7 (seven) days.   ZINC 15 PO Take 1 tablet by mouth daily.      Environmental History: Akshath lives in a 58 year old house with wood flooring throughout. Heating is gas and cooling is central. There are cats and dogs in the home and wild animals outside. There is no concern for roaches in the home. There is concern for exposure to fumes, chemicals, and tabacco smoke in the home and at work. 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   Known medication allergies: No Known Allergies   Physical examination: Blood pressure 136/78, pulse 74, temperature 97.6 F (36.4 C), temperature source Oral, resp. rate 16, height 5\' 11"  (1.803 m), weight 228 lb (103.4 kg), SpO2 96 %.  General: Alert, interactive, in no acute distress. HEENT: TMs pearly gray, turbinates moderately edematous with clear discharge, post-pharynx mildly erythematous. Neck: Supple without lymphadenopathy. Lungs: Clear to auscultation without wheezing, rhonchi or rales. {no increased work of breathing. CV: Normal S1, S2 without murmurs. Abdomen: Nondistended, nontender. Skin: Warm and dry, without lesions or rashes. Extremities:  No clubbing, cyanosis or edema. Neuro:   Grossly intact.  Diagnositics/Labs:  Spirometry: FEV1: 4.94, FVC: 4.02, ratio consistent with normal ventilatory function  Allergy testing: Percutaneous testing was positive to [place] pollen, penicillium mix, botris cinera, epicoccum nigrum, and phoma betae with adequate controls.   Intradermal skin testing was positive to: [place], [place], mold mix 1 and mold mix 4 with adequate controls.   Allergy testing results were read and interpreted by provider, documented by clinical staff.   Assessment and plan: Patient Instructions  Allergic rhinitis Your skin testing was positive to molds, grass pollen Avoidance measures have been provided Begin Xhance 2 applications twice a  day for nasal symptoms Continue saline nasal rinses as needed for nasal symptoms Azelastine nasal spray as needed for a runny nose twice a day.   Atrovent nasal spray as needed for a runny nose up to 3-4 times a day.  Stop Claritin D and begin cetirizine 10 mg once a day as needed for a runny nose Information about allergy injections has been provided today. Please call the clinic with any questions  Follow up in 3-6 months or sooner if needed  I appreciate the opportunity to take part in Jimmie's care. Please do not hesitate to contact me with questions.  Sincerely,  Gareth Morgan, FNP [facility]   Attestation: I performed/discussed the history and physical examination of the patient as well as management with NP Mashanda Ishibashi. I reviewed the NP's note and agree with the documented findings and plan of care with following additions/exceptions:  Prudy Feeler, MD Allergy/Immunology [facility] [place]

## 2018-10-29 NOTE — Patient Instructions (Addendum)
Allergic rhinitis Your skin testing was positive to molds, grass pollen Avoidance measures have been provided Begin Xhance 2 applications twice a day for nasal symptoms Continue saline nasal rinses as needed for nasal symptoms Azelastine nasal spray as needed for a runny nose Atrovent nasal spray as needed for a runny nose Stop Claritin D and begin cetirizine 10 mg once a day as needed for a runny nose Information about allergy injections has been provided today. Please call the clinic with any questions  Follow up in 3-6 months or sooner if needed

## 2018-12-02 DIAGNOSIS — C4441 Basal cell carcinoma of skin of scalp and neck: Secondary | ICD-10-CM | POA: Diagnosis not present

## 2018-12-02 DIAGNOSIS — D225 Melanocytic nevi of trunk: Secondary | ICD-10-CM | POA: Diagnosis not present

## 2018-12-02 DIAGNOSIS — Z85828 Personal history of other malignant neoplasm of skin: Secondary | ICD-10-CM | POA: Diagnosis not present

## 2018-12-02 DIAGNOSIS — D0461 Carcinoma in situ of skin of right upper limb, including shoulder: Secondary | ICD-10-CM | POA: Diagnosis not present

## 2018-12-02 DIAGNOSIS — C44619 Basal cell carcinoma of skin of left upper limb, including shoulder: Secondary | ICD-10-CM | POA: Diagnosis not present

## 2019-09-02 ENCOUNTER — Ambulatory Visit: Payer: 59 | Admitting: Family Medicine
